# Patient Record
Sex: Female | Born: 1980 | Race: Black or African American | Hispanic: No | Smoking: Former smoker
Health system: Southern US, Community
[De-identification: ages and names within clinical notes are randomized; demographics above are authoritative.]

## PROBLEM LIST (undated history)

## (undated) DIAGNOSIS — D649 Anemia, unspecified: Secondary | ICD-10-CM

## (undated) DIAGNOSIS — N979 Female infertility, unspecified: Secondary | ICD-10-CM

## (undated) DIAGNOSIS — J302 Other seasonal allergic rhinitis: Secondary | ICD-10-CM

## (undated) DIAGNOSIS — J45909 Unspecified asthma, uncomplicated: Secondary | ICD-10-CM

## (undated) DIAGNOSIS — R011 Cardiac murmur, unspecified: Secondary | ICD-10-CM

## (undated) DIAGNOSIS — I10 Essential (primary) hypertension: Secondary | ICD-10-CM

## (undated) DIAGNOSIS — D259 Leiomyoma of uterus, unspecified: Secondary | ICD-10-CM

## (undated) DIAGNOSIS — N189 Chronic kidney disease, unspecified: Secondary | ICD-10-CM

## (undated) DIAGNOSIS — R519 Headache, unspecified: Secondary | ICD-10-CM

## (undated) DIAGNOSIS — N840 Polyp of corpus uteri: Secondary | ICD-10-CM

## (undated) HISTORY — DX: Chronic kidney disease, unspecified: N18.9

## (undated) HISTORY — DX: Anemia, unspecified: D64.9

## (undated) HISTORY — DX: Other seasonal allergic rhinitis: J30.2

## (undated) HISTORY — DX: Leiomyoma of uterus, unspecified: D25.9

## (undated) HISTORY — DX: Unspecified asthma, uncomplicated: J45.909

## (undated) HISTORY — DX: Headache, unspecified: R51.9

## (undated) HISTORY — DX: Female infertility, unspecified: N97.9

## (undated) HISTORY — DX: Polyp of corpus uteri: N84.0

## (undated) HISTORY — DX: Cardiac murmur, unspecified: R01.1

---
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
## 2010-10-02 ENCOUNTER — Emergency Department (HOSPITAL_COMMUNITY)
Admission: EM | Admit: 2010-10-02 | Discharge: 2010-10-02 | Disposition: A | Discharge status: Home / Self Care | Payer: Self-pay | Attending: Emergency Medicine | Admitting: Emergency Medicine

## 2010-10-02 ENCOUNTER — Emergency Department (HOSPITAL_COMMUNITY): Payer: Self-pay

## 2010-10-02 DIAGNOSIS — X58XXXA Exposure to other specified factors, initial encounter: Secondary | ICD-10-CM | POA: Insufficient documentation

## 2010-10-02 DIAGNOSIS — S93409A Sprain of unspecified ligament of unspecified ankle, initial encounter: Secondary | ICD-10-CM | POA: Insufficient documentation

## 2010-10-02 DIAGNOSIS — J45909 Unspecified asthma, uncomplicated: Secondary | ICD-10-CM | POA: Insufficient documentation

## 2010-10-02 DIAGNOSIS — I10 Essential (primary) hypertension: Secondary | ICD-10-CM | POA: Insufficient documentation

## 2011-01-31 ENCOUNTER — Emergency Department (HOSPITAL_COMMUNITY)
Admission: EM | Admit: 2011-01-31 | Discharge: 2011-01-31 | Disposition: A | Discharge status: Home / Self Care | Payer: Self-pay | Attending: Emergency Medicine | Admitting: Emergency Medicine

## 2011-01-31 DIAGNOSIS — R51 Headache: Secondary | ICD-10-CM | POA: Insufficient documentation

## 2011-01-31 DIAGNOSIS — A499 Bacterial infection, unspecified: Secondary | ICD-10-CM | POA: Insufficient documentation

## 2011-01-31 DIAGNOSIS — I10 Essential (primary) hypertension: Secondary | ICD-10-CM | POA: Insufficient documentation

## 2011-01-31 DIAGNOSIS — B9689 Other specified bacterial agents as the cause of diseases classified elsewhere: Secondary | ICD-10-CM | POA: Insufficient documentation

## 2011-01-31 DIAGNOSIS — N760 Acute vaginitis: Secondary | ICD-10-CM | POA: Insufficient documentation

## 2011-01-31 LAB — URINALYSIS, ROUTINE W REFLEX MICROSCOPIC
Bilirubin Urine: NEGATIVE
Ketones, ur: NEGATIVE mg/dL
Nitrite: NEGATIVE
Urobilinogen, UA: 0.2 mg/dL (ref 0.0–1.0)

## 2011-01-31 LAB — POCT I-STAT, CHEM 8
BUN: 9 mg/dL (ref 6–23)
Calcium, Ion: 1.16 mmol/L (ref 1.12–1.32)
Chloride: 105 mEq/L (ref 96–112)
Glucose, Bld: 80 mg/dL (ref 70–99)
HCT: 32 % — ABNORMAL LOW (ref 36.0–46.0)
Potassium: 3.6 mEq/L (ref 3.5–5.1)

## 2011-01-31 LAB — WET PREP, GENITAL: Yeast Wet Prep HPF POC: NONE SEEN

## 2011-01-31 LAB — POCT PREGNANCY, URINE: Preg Test, Ur: NEGATIVE

## 2011-01-31 LAB — URINE MICROSCOPIC-ADD ON

## 2011-02-01 LAB — GC/CHLAMYDIA PROBE AMP, GENITAL: GC Probe Amp, Genital: NEGATIVE

## 2011-03-11 IMAGING — CR DG TIBIA/FIBULA 2V*R*
4 series · 4 of 4 positions shown · non-contrast
Comparison: None

CLINICAL DATA: Right lower leg pain.  Ankle pain.

RIGHT TIBIA AND FIBULA - 2 VIEW

[t tib/fib ap right (1 of 2)]
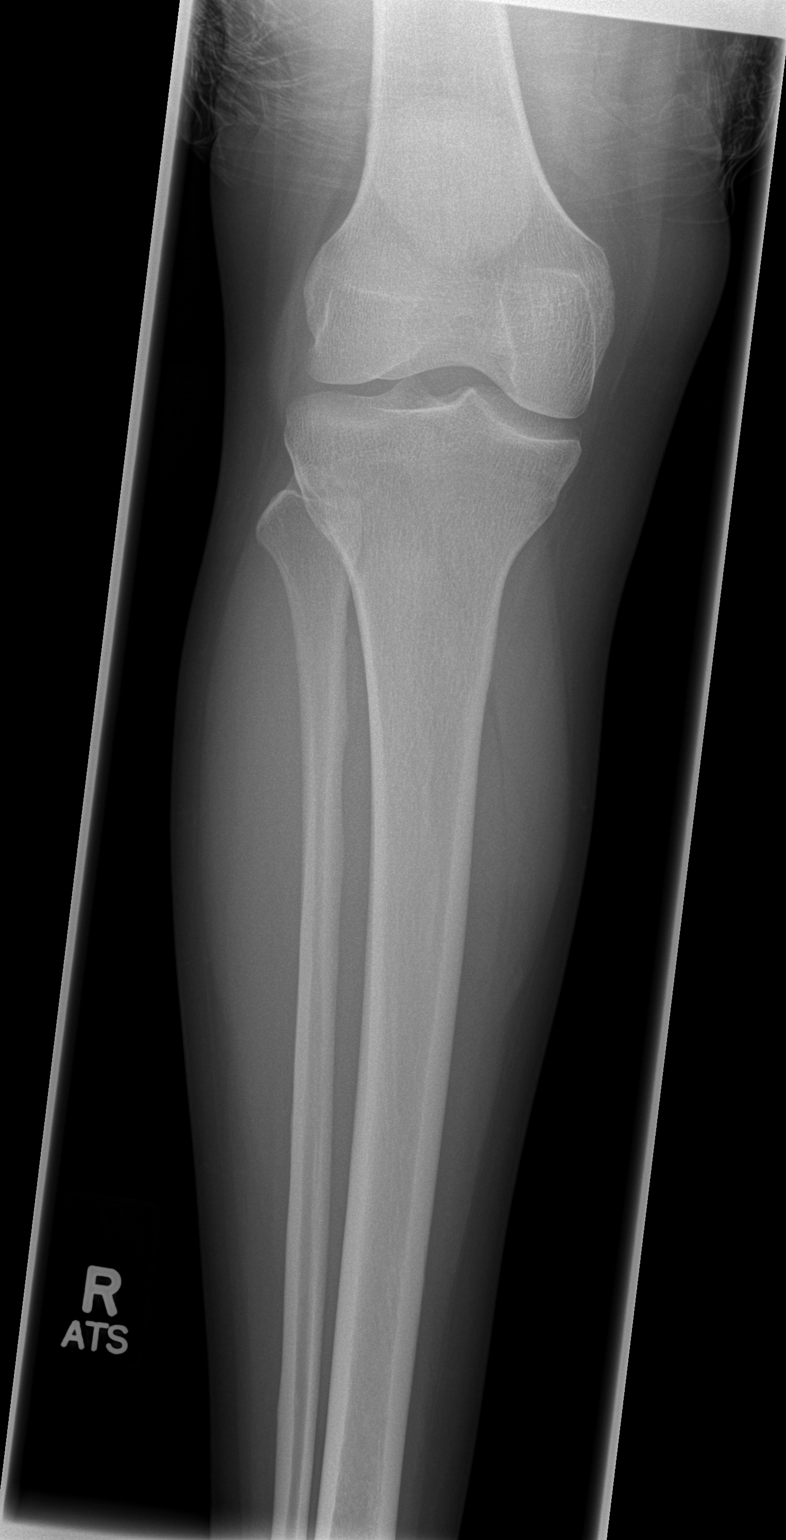

[t tib/fib ap right (2 of 2)]
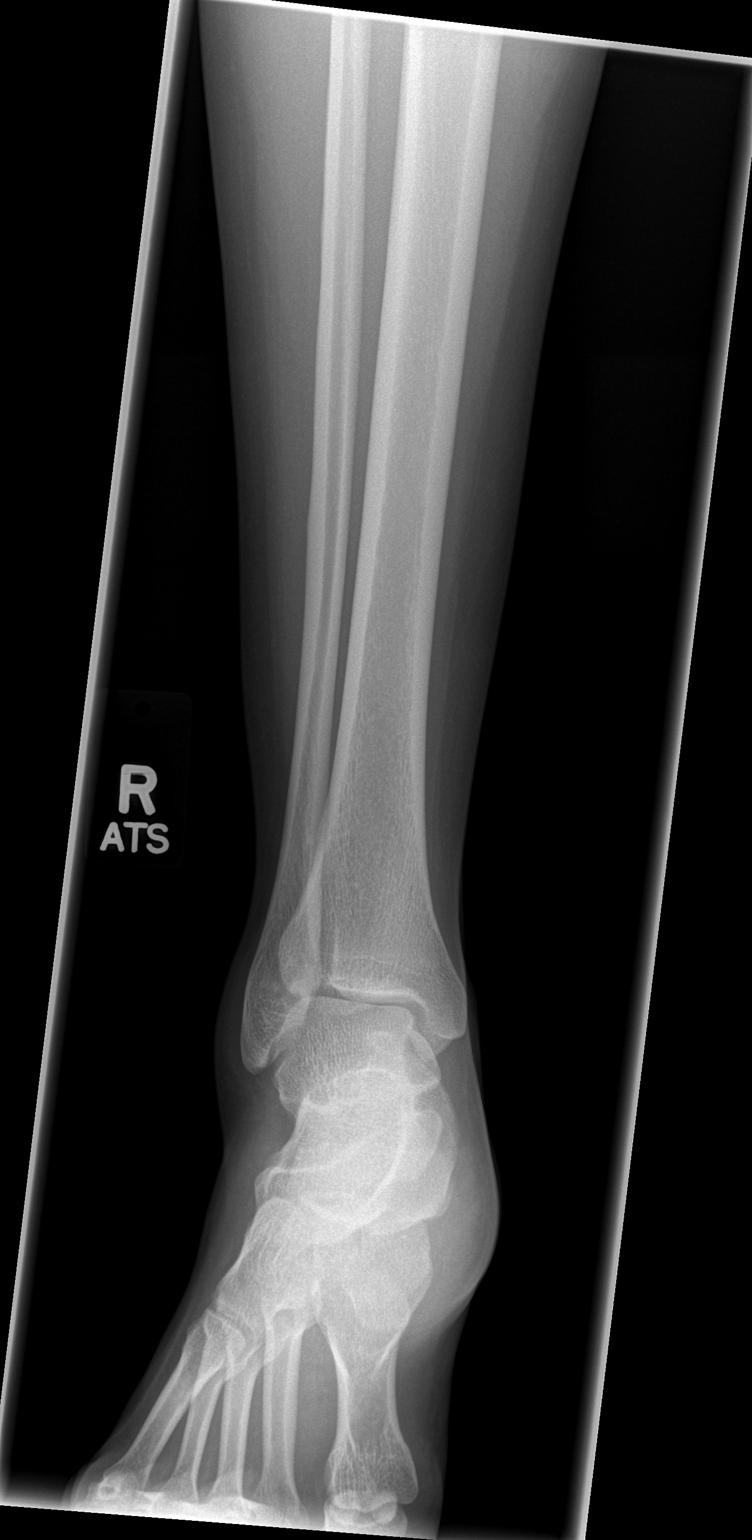

[t tib/fib lat right (1 of 2)]
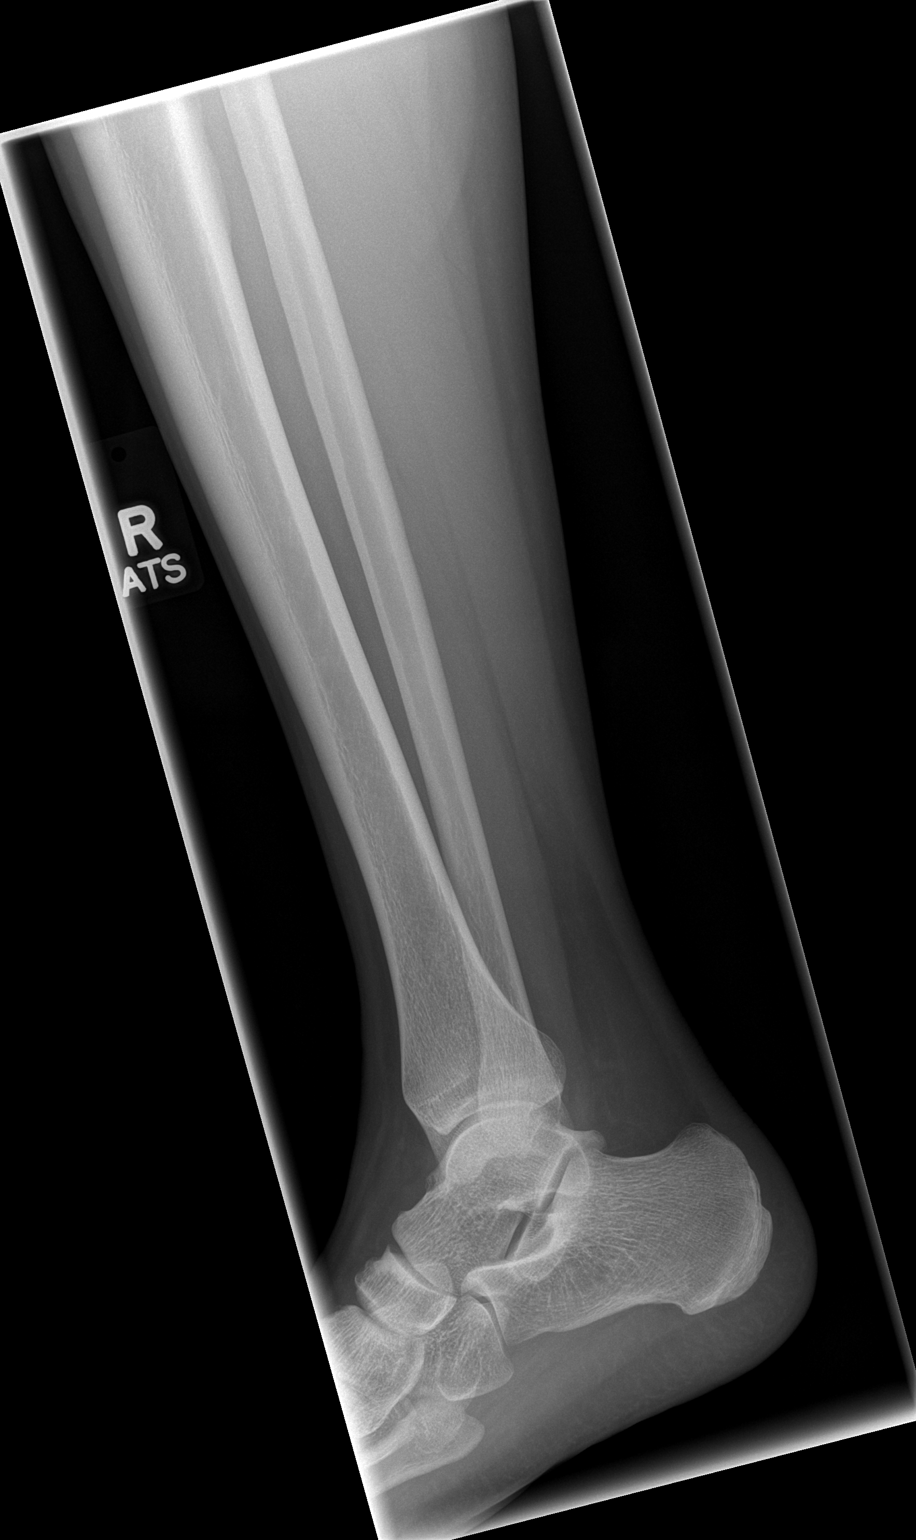

[t tib/fib lat right (2 of 2)]
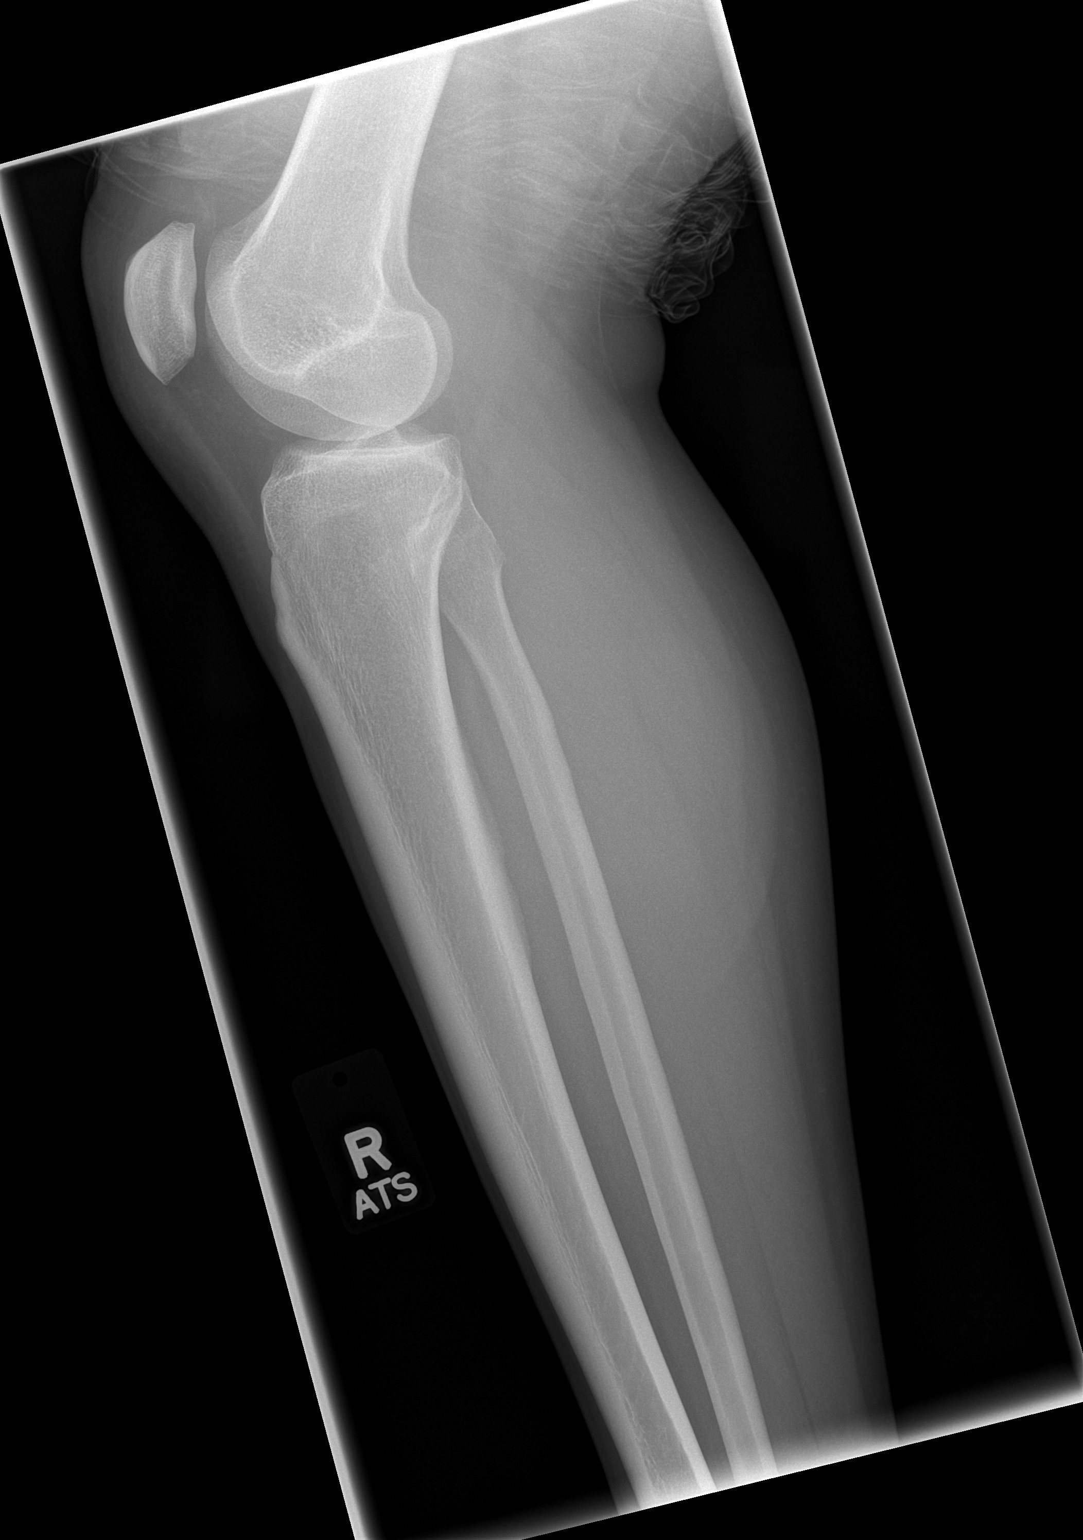

[4 of 4 positions shown; findings below may reference images not displayed]

FINDINGS: No fracture, foreign body, or acute bony findings are
identified.
IMPRESSION: No significant abnormality identified.

## 2011-06-26 ENCOUNTER — Encounter: Payer: Self-pay | Admitting: *Deleted

## 2011-06-26 ENCOUNTER — Emergency Department (HOSPITAL_COMMUNITY)
Admission: EM | Admit: 2011-06-26 | Discharge: 2011-06-26 | Disposition: A | Discharge status: Home / Self Care | Payer: Self-pay | Attending: Emergency Medicine | Admitting: Emergency Medicine

## 2011-06-26 DIAGNOSIS — M545 Low back pain, unspecified: Secondary | ICD-10-CM | POA: Insufficient documentation

## 2011-06-26 DIAGNOSIS — N946 Dysmenorrhea, unspecified: Secondary | ICD-10-CM | POA: Insufficient documentation

## 2011-06-26 DIAGNOSIS — D509 Iron deficiency anemia, unspecified: Secondary | ICD-10-CM | POA: Insufficient documentation

## 2011-06-26 DIAGNOSIS — I10 Essential (primary) hypertension: Secondary | ICD-10-CM | POA: Insufficient documentation

## 2011-06-26 DIAGNOSIS — R109 Unspecified abdominal pain: Secondary | ICD-10-CM | POA: Insufficient documentation

## 2011-06-26 DIAGNOSIS — N949 Unspecified condition associated with female genital organs and menstrual cycle: Secondary | ICD-10-CM | POA: Insufficient documentation

## 2011-06-26 HISTORY — DX: Essential (primary) hypertension: I10

## 2011-06-26 LAB — COMPREHENSIVE METABOLIC PANEL
ALT: 5 U/L (ref 0–35)
Alkaline Phosphatase: 44 U/L (ref 39–117)
BUN: 11 mg/dL (ref 6–23)
CO2: 21 mEq/L (ref 19–32)
Chloride: 106 mEq/L (ref 96–112)
GFR calc Af Amer: 78 mL/min — ABNORMAL LOW (ref >90)
GFR calc non Af Amer: 67 mL/min — ABNORMAL LOW (ref >90)
Glucose, Bld: 79 mg/dL (ref 70–99)
Potassium: 3.9 mEq/L (ref 3.5–5.1)
Sodium: 135 mEq/L (ref 135–145)
Total Bilirubin: 0.2 mg/dL — ABNORMAL LOW (ref 0.3–1.2)
Total Protein: 7.2 g/dL (ref 6.0–8.3)

## 2011-06-26 LAB — CBC
Hemoglobin: 10.1 g/dL — ABNORMAL LOW (ref 12.0–15.0)
MCH: 27.7 pg (ref 26.0–34.0)
Platelets: 288 10*3/uL (ref 150–400)
RBC: 3.65 MIL/uL — ABNORMAL LOW (ref 3.87–5.11)
WBC: 7.7 10*3/uL (ref 4.0–10.5)

## 2011-06-26 LAB — WET PREP, GENITAL: Yeast Wet Prep HPF POC: NONE SEEN

## 2011-06-26 LAB — DIFFERENTIAL
Eosinophils Absolute: 0 10*3/uL (ref 0.0–0.7)
Lymphocytes Relative: 19 % (ref 12–46)
Lymphs Abs: 1.5 10*3/uL (ref 0.7–4.0)
Monocytes Relative: 6 % (ref 3–12)
Neutrophils Relative %: 74 % (ref 43–77)

## 2011-06-26 LAB — PREGNANCY, URINE: Preg Test, Ur: NEGATIVE

## 2011-06-26 LAB — URINALYSIS, MICROSCOPIC ONLY
Glucose, UA: NEGATIVE mg/dL
Ketones, ur: NEGATIVE mg/dL
Leukocytes, UA: NEGATIVE
Nitrite: NEGATIVE
pH: 7 (ref 5.0–8.0)

## 2011-06-26 MED ORDER — FERROUS SULFATE 325 (65 FE) MG PO TABS: 325.0000 mg | ORAL_TABLET | Freq: Every day | ORAL | Status: DC

## 2011-06-26 MED ORDER — IBUPROFEN 800 MG PO TABS: 800.0000 mg | ORAL_TABLET | Freq: Three times a day (TID) | ORAL | Status: AC

## 2011-06-26 MED ORDER — KETOROLAC TROMETHAMINE 30 MG/ML IJ SOLN
30.0000 mg | Freq: Once | INTRAMUSCULAR | Status: AC
  Administered 2011-06-26: 30 mg via INTRAVENOUS
  Filled 2011-06-26: qty 1

## 2011-06-26 MED ORDER — SODIUM CHLORIDE 0.9 % IV SOLN
INTRAVENOUS | Status: DC
  Administered 2011-06-26: 09:00:00 via INTRAVENOUS

## 2011-06-26 NOTE — ED Provider Notes (Signed)
History     CSN: [id number] Arrival date & time: 06/26/2011  6:39 AM   First MD Initiated Contact with Patient 06/26/11 [phone]      Chief Complaint  Patient presents with  . Abdominal Pain    (Consider location/radiation/quality/duration/timing/severity/associated sxs/prior treatment) Patient is a 30 y.o. female presenting with abdominal pain. The history is provided by the patient and medical records. No language interpreter was used.  Abdominal Pain The primary symptoms of the illness include abdominal pain and vaginal bleeding. The primary symptoms of the illness do not include nausea, vomiting, diarrhea or dysuria. The current episode started 13 to 24 hours ago. The onset of the illness was sudden. The problem has not changed since onset. Vaginal bleeding was first noticed 2 days ago. Number of occurrences: Pt had onset of menses 2 days ago, and yesterday had onset of pain in the suprapubic region, cramping worse than previously. The quantity of blood was heavier than menses.  The patient states that she believes she is currently not pregnant. The patient has not had a change in bowel habit.    Past Medical History  Diagnosis Date  . Hypertension     History reviewed. No pertinent past surgical history.  History reviewed. No pertinent family history.  History  Substance Use Topics  . Smoking status: Not on file  . Smokeless tobacco: Not on file  . Alcohol Use:     OB History    Grav Para Term Preterm Abortions TAB SAB Ect Mult Living                  Review of Systems  Constitutional: Negative.   HENT: Negative.   Eyes: Negative.   Respiratory: Negative.   Cardiovascular: Negative.   Gastrointestinal: Positive for abdominal pain. Negative for nausea, vomiting and diarrhea.  Genitourinary: Positive for vaginal bleeding, menstrual problem and pelvic pain. Negative for dysuria.  Musculoskeletal: Negative.   Skin: Negative.   Neurological: Negative.     Psychiatric/Behavioral: Negative.     Allergies  Review of patient's allergies indicates no known allergies.  Home Medications   Current Outpatient Rx  Name Route Sig Dispense Refill  . ACETAMINOPHEN 500 MG PO TABS Oral Take 1,000 mg by mouth every 6 (six) hours as needed. For pain      . FERROUS SULFATE 325 (65 FE) MG PO TABS Oral Take 1 tablet (325 mg total) by mouth daily. 30 tablet 0  . IBUPROFEN 800 MG PO TABS Oral Take 1 tablet (800 mg total) by mouth 3 (three) times daily. 21 tablet 0    BP 132/92  Pulse 55  Temp(Src) 98 F (36.7 C) (Oral)  Resp 15  SpO2 99%  Physical Exam  Nursing note and vitals reviewed. Constitutional: She is oriented to person, place, and time. She appears well-developed and well-nourished. Distressed: in moderate distress with suprapubic and more back pain.  HENT:  Head: Normocephalic and atraumatic.  Right Ear: External ear normal.  Mouth/Throat: Oropharyngeal exudate:  Her oral mucosa is dry.  Eyes: Conjunctivae and EOM are normal. Pupils are equal, round, and reactive to light.  Neck: Normal range of motion. Neck supple. No thyromegaly present.  Cardiovascular: Normal rate, regular rhythm and normal heart sounds.   Pulmonary/Chest: Effort normal and breath sounds normal.  Abdominal: Soft. There is Tenderness: he has suprapubic tenderness..  Genitourinary:       Pt has heavy vaginal bleeding.  Her vaginal introitus is very tight.  The speculum would not enter  her vagina.  Swabs were obtained for GC/Chlamydia and wet prep.  Bimanual exam could not be performed due to her tight introitus.  Musculoskeletal:       He has pain localized to the lumbar region comment there is no palpable bony deformity or point tenderness there.  Lymphadenopathy:    She has no cervical adenopathy.  Neurological: She is alert and oriented to person, place, and time.       Sensory or motor deficits.  Skin: Skin is warm and dry.  Psychiatric: She has a normal mood  and affect. Her behavior is normal.    ED Course  Procedures (including critical care time)  2:48 PM Issues and had physical exam performed. IV fluids were ordered. Laboratory testing was ordered. Pelvic exam will be performed.  2:48 PM Results for orders placed during the hospital encounter of 06/26/11  CBC      Component Value Range   WBC 7.7  4.0 - 10.5 (K/uL)   RBC 3.65 (*) 3.87 - 5.11 (MIL/uL)   Hemoglobin 10.1 (*) 12.0 - 15.0 (g/dL)   HCT 16.1 (*) 09.6 - 46.0 (%)   MCV 84.7  78.0 - 100.0 (fL)   MCH 27.7  26.0 - 34.0 (pg)   MCHC 32.7  30.0 - 36.0 (g/dL)   RDW 04.5  40.9 - 81.1 (%)   Platelets 288  150 - 400 (K/uL)  DIFFERENTIAL      Component Value Range   Neutrophils Relative 74  43 - 77 (%)   Neutro Abs 5.7  1.7 - 7.7 (K/uL)   Lymphocytes Relative 19  12 - 46 (%)   Lymphs Abs 1.5  0.7 - 4.0 (K/uL)   Monocytes Relative 6  3 - 12 (%)   Monocytes Absolute 0.5  0.1 - 1.0 (K/uL)   Eosinophils Relative 1  0 - 5 (%)   Eosinophils Absolute 0.0  0.0 - 0.7 (K/uL)   Basophils Relative 0  0 - 1 (%)   Basophils Absolute 0.0  0.0 - 0.1 (K/uL)  COMPREHENSIVE METABOLIC PANEL      Component Value Range   Sodium 135  135 - 145 (mEq/L)   Potassium 3.9  3.5 - 5.1 (mEq/L)   Chloride 106  96 - 112 (mEq/L)   CO2 21  19 - 32 (mEq/L)   Glucose, Bld 79  70 - 99 (mg/dL)   BUN 11  6 - 23 (mg/dL)   Creatinine, Ser 9.14  0.50 - 1.10 (mg/dL)   Calcium 8.2 (*) 8.4 - 10.5 (mg/dL)   Total Protein 7.2  6.0 - 8.3 (g/dL)   Albumin 3.3 (*) 3.5 - 5.2 (g/dL)   AST 14  0 - 37 (U/L)   ALT 5  0 - 35 (U/L)   Alkaline Phosphatase 44  39 - 117 (U/L)   Total Bilirubin 0.2 (*) 0.3 - 1.2 (mg/dL)   GFR calc non Af Amer 67 (*) >90 (mL/min)   GFR calc Af Amer 78 (*) >90 (mL/min)  WET PREP, GENITAL      Component Value Range   Yeast, Wet Prep NONE SEEN  NONE SEEN    Trich, Wet Prep NONE SEEN  NONE SEEN    Clue Cells, Wet Prep NONE SEEN  NONE SEEN    WBC, Wet Prep HPF POC FEW (*) NONE SEEN   PREGNANCY,  URINE      Component Value Range   Preg Test, Ur NEGATIVE    URINALYSIS, MICROSCOPIC ONLY  Component Value Range   Color, Urine YELLOW  YELLOW    APPearance CLOUDY (*) CLEAR    Specific Gravity, Urine 1.013  1.005 - 1.030    pH 7.0  5.0 - 8.0    Glucose, UA NEGATIVE  NEGATIVE (mg/dL)   Hgb urine dipstick SMALL (*) NEGATIVE    Bilirubin Urine NEGATIVE  NEGATIVE    Ketones, ur NEGATIVE  NEGATIVE (mg/dL)   Protein, ur 409 (*) NEGATIVE (mg/dL)   Urobilinogen, UA 0.2  0.0 - 1.0 (mg/dL)   Nitrite NEGATIVE  NEGATIVE    Leukocytes, UA NEGATIVE  NEGATIVE    RBC / HPF 0-2  <3 (RBC/hpf)   Squamous Epithelial / LPF FEW (*) RARE   URINE CULTURE      Component Value Range   Specimen Description URINE, CLEAN CATCH     Special Requests NONE     Setup Time [id number]     Colony Count 3,000 COLONIES/ML     Culture INSIGNIFICANT GROWTH     Report Status 06/27/2011 FINAL     Lab tests show a mild anemia. There was no vaginal infection and a UTI. Prescription for ibuprofen for dysmenorrhea, and iron for anemia.   1. Dysmenorrhea   2. Iron deficiency anemia          Carleene Cooper III, MD 06/27/11 [phone]

## 2011-06-26 NOTE — ED Notes (Signed)
Pt states that she woke up with lower abdominal pain. Pt states that she is not pregnant. Pt states the pain is a 10/10.

## 2011-06-26 NOTE — ED Notes (Signed)
Pt states that she is having intermitant lower abdominal pain. Pt states that the pain feels like severe cramping. Pt states that she started spotting two days ago and then yesterday was flowing then today she stopped suddenly no more period. Pt states that she has no pain with urination or frequency. Pt states that she is not having any bowel problems. Pt alert and oriented and able to follow commands and move extremities.

## 2011-06-27 LAB — URINE CULTURE

## 2015-08-14 ENCOUNTER — Emergency Department (HOSPITAL_COMMUNITY)
Admission: EM | Admit: 2015-08-14 | Discharge: 2015-08-14 | Disposition: A | Discharge status: Home / Self Care | Payer: Self-pay | Attending: Emergency Medicine | Admitting: Emergency Medicine

## 2015-08-14 ENCOUNTER — Encounter (HOSPITAL_COMMUNITY): Payer: Self-pay | Admitting: Neurology

## 2015-08-14 DIAGNOSIS — N390 Urinary tract infection, site not specified: Secondary | ICD-10-CM

## 2015-08-14 DIAGNOSIS — I10 Essential (primary) hypertension: Secondary | ICD-10-CM | POA: Insufficient documentation

## 2015-08-14 DIAGNOSIS — B3731 Acute candidiasis of vulva and vagina: Secondary | ICD-10-CM

## 2015-08-14 DIAGNOSIS — B373 Candidiasis of vulva and vagina: Secondary | ICD-10-CM | POA: Insufficient documentation

## 2015-08-14 DIAGNOSIS — B9689 Other specified bacterial agents as the cause of diseases classified elsewhere: Secondary | ICD-10-CM

## 2015-08-14 DIAGNOSIS — Z79899 Other long term (current) drug therapy: Secondary | ICD-10-CM | POA: Insufficient documentation

## 2015-08-14 DIAGNOSIS — N760 Acute vaginitis: Secondary | ICD-10-CM | POA: Insufficient documentation

## 2015-08-14 DIAGNOSIS — Z3202 Encounter for pregnancy test, result negative: Secondary | ICD-10-CM | POA: Insufficient documentation

## 2015-08-14 DIAGNOSIS — F17200 Nicotine dependence, unspecified, uncomplicated: Secondary | ICD-10-CM | POA: Insufficient documentation

## 2015-08-14 LAB — WET PREP, GENITAL
Sperm: NONE SEEN
Trich, Wet Prep: NONE SEEN

## 2015-08-14 LAB — URINE MICROSCOPIC-ADD ON

## 2015-08-14 LAB — URINALYSIS, ROUTINE W REFLEX MICROSCOPIC
Bilirubin Urine: NEGATIVE
Glucose, UA: NEGATIVE mg/dL
Ketones, ur: NEGATIVE mg/dL
Nitrite: NEGATIVE
Protein, ur: 100 mg/dL — AB
Specific Gravity, Urine: 1.014 (ref 1.005–1.030)
pH: 6 (ref 5.0–8.0)

## 2015-08-14 LAB — POC URINE PREG, ED: Preg Test, Ur: NEGATIVE

## 2015-08-14 MED ORDER — HYDROXYZINE HCL 25 MG PO TABS
25.0000 mg | ORAL_TABLET | Freq: Once | ORAL | Status: AC
  Administered 2015-08-14: 25 mg via ORAL
  Filled 2015-08-14: qty 1

## 2015-08-14 MED ORDER — OXYCODONE-ACETAMINOPHEN 5-325 MG PO TABS: 2.0000 | ORAL_TABLET | ORAL | Status: DC | PRN

## 2015-08-14 MED ORDER — SULFAMETHOXAZOLE-TRIMETHOPRIM 800-160 MG PO TABS: 1.0000 | ORAL_TABLET | Freq: Two times a day (BID) | ORAL | Status: AC

## 2015-08-14 MED ORDER — OXYCODONE-ACETAMINOPHEN 5-325 MG PO TABS
1.0000 | ORAL_TABLET | Freq: Once | ORAL | Status: AC
  Administered 2015-08-14: 1 via ORAL
  Filled 2015-08-14: qty 1

## 2015-08-14 MED ORDER — HYDROXYZINE HCL 25 MG PO TABS: 25.0000 mg | ORAL_TABLET | Freq: Four times a day (QID) | ORAL | Status: DC

## 2015-08-14 MED ORDER — FLUCONAZOLE 100 MG PO TABS
150.0000 mg | ORAL_TABLET | Freq: Once | ORAL | Status: AC
  Administered 2015-08-14: 150 mg via ORAL
  Filled 2015-08-14: qty 2

## 2015-08-14 MED ORDER — METRONIDAZOLE 1 % EX GEL: Freq: Every day | CUTANEOUS | Status: DC

## 2015-08-14 NOTE — ED Notes (Addendum)
PA-C to see and assess patient before RN assessment. See PA-C note. Pelvic cart set up and at bedside.

## 2015-08-14 NOTE — ED Notes (Signed)
Pt reports vaginal itching and irritation for several days after using a new soap. Today she has had vaginal bleeding. LMP 07/30/15.

## 2015-08-14 NOTE — ED Provider Notes (Signed)
CSN: [id number]     Arrival date & time 08/14/15  1739 History  By signing my name below, I, Rayna Sexton, attest that this documentation has been prepared under the direction and in the presence of [employer], PA-C. Electronically Signed: Rayna Sexton, ED Scribe. 08/14/2015. 8:23 PM.    Chief Complaint  Patient presents with  . Vaginal Itching   The history is provided by the patient. No language interpreter was used.    HPI Comments: Tonya Wilson is a 35 y.o. female with a hx of bacterial vaginosis who presents to the Emergency Department complaining of intermittent, moderate, vaginal irritation onset a few days ago. Pt notes that she began using white Dial soap prior to the onset of her symptoms. Pt began applying a gel from a prior case of bacterial vaginosis and taking benadryl which provided short term relief until this morning. She now notes experiencing a continuation of her irritation with associated, white discharge, nausea, and vaginal bleeding. Pt states that her labia feel swollen. Her LNMP was 1/5. She denies vomiting, fevers, chills, abd pain or any other associated symptoms at this time.      Past Medical History  Diagnosis Date  . Hypertension    History reviewed. No pertinent past surgical history. No family history on file. Social History  Substance Use Topics  . Smoking status: Current Every Day Smoker  . Smokeless tobacco: None  . Alcohol Use: No   OB History    No data available     Review of Systems  All other systems reviewed and are negative.   Allergies  Flagyl  Home Medications   Prior to Admission medications   Medication Sig Start Date End Date Taking? Authorizing Provider  acetaminophen (TYLENOL) 500 MG tablet Take 1,000 mg by mouth every 6 (six) hours as needed. For pain      Historical Provider, MD  ferrous sulfate 325 (65 FE) MG tablet Take 1 tablet (325 mg total) by mouth daily. 06/26/11 06/25/12  Mylinda Latina, MD    BP 152/116 mmHg  Pulse 87  Temp(Src) 98.5 F (36.9 C) (Oral)  Resp 16  SpO2 100%  LMP 07/30/2015    Physical Exam  Constitutional: She is oriented to person, place, and time. She appears well-developed and well-nourished. No distress.  HENT:  Head: Normocephalic and atraumatic.  Eyes: Conjunctivae are normal. Right eye exhibits no discharge. Left eye exhibits no discharge. No scleral icterus.  Cardiovascular: Normal rate.   Pulmonary/Chest: Effort normal.  Genitourinary:  Copious white vaginal discharge in vaginal vault. Bilateral labia are exquisitely tender and very swollen and irritated. There is swelling at the introitus. Unable to insert speculum completely due to swelling and pain. Fix not visualized. No external genital lesions.  Neurological: She is alert and oriented to person, place, and time. Coordination normal.  Skin: Skin is warm and dry. No rash noted. She is not diaphoretic. No erythema. No pallor.  Psychiatric: She has a normal mood and affect. Her behavior is normal.  Nursing note and vitals reviewed.   ED Course  Procedures  DIAGNOSTIC STUDIES: Oxygen Saturation is 100% on RA, normal by my interpretation.    COORDINATION OF CARE: 8:19 PM Pt presents today due to vaginal irriation. Discussed next steps with pt at bedside including a pelvic exam. Pt agreed to plan.  Labs Review Labs Reviewed  WET PREP, GENITAL - Abnormal; Notable for the following:    Yeast Wet Prep HPF POC PRESENT (*)  Clue Cells Wet Prep HPF POC PRESENT (*)    WBC, Wet Prep HPF POC FEW (*)    All other components within normal limits  URINALYSIS, ROUTINE W REFLEX MICROSCOPIC (NOT AT [facility]) - Abnormal; Notable for the following:    Hgb urine dipstick MODERATE (*)    Protein, ur 100 (*)    Leukocytes, UA TRACE (*)    All other components within normal limits  URINE MICROSCOPIC-ADD ON - Abnormal; Notable for the following:    Squamous Epithelial / LPF 0-5 (*)    Bacteria, UA FEW (*)     All other components within normal limits  POC URINE PREG, ED  GC/CHLAMYDIA PROBE AMP ([place]) NOT AT [facility]    Imaging Review No results found. I have personally reviewed and evaluated these lab results as part of my medical decision-making.   EKG Interpretation None      MDM   Final diagnoses:  Vaginal candidiasis  Bacterial vaginosis  UTI (lower urinary tract infection)   Otherwise healthy 35 year old female presents with vaginal irritation onset 4 days ago after using Dial soap. No vaginal bleeding present. No dysuria. Patient does report increased urgency and frequency. On exam labia are extremely tender and swollen. Copious white vaginal discharge in vaginal vault. Unable to insert speculum due to pain and swelling. Swabs collected at the introitus. Wet prep reveals yeast and clue cells. Pt given 150 mg fluconazole, pain medication and atarax for itching. UA reveals trace leukocytes, 6-30 WBCs and few bacteria. Given urgency and frequency symptoms we'll treat UTI as well with antibiotics. Low suspicion STI as patient is not sexually active. Encourage patient to avoid using the soap in the future. Patient may follow up with OB/GYN if symptoms do not improve. Referral given today. Return precautions outlined in patient discharge instructions. Patient is hemodynamically stable and ready for discharge.  I personally performed the services described in this documentation, which was scribed in my presence. The recorded information has been reviewed and is accurate.     Dondra Spry [place], PA-C 08/15/15 2312  Wandra Arthurs, MD 08/17/15 [phone]

## 2015-08-14 NOTE — Discharge Instructions (Signed)
Bacterial Vaginosis °Bacterial vaginosis is a vaginal infection that occurs when the normal balance of bacteria in the vagina is disrupted. It results from an overgrowth of certain bacteria. This is the most common vaginal infection in women of childbearing age. Treatment is important to prevent complications, especially in pregnant women, as it can cause a premature delivery. °CAUSES  °Bacterial vaginosis is caused by an increase in harmful bacteria that are normally present in smaller amounts in the vagina. Several different kinds of bacteria can cause bacterial vaginosis. However, the reason that the condition develops is not fully understood. °RISK FACTORS °Certain activities or behaviors can put you at an increased risk of developing bacterial vaginosis, including: °· Having a new sex partner or multiple sex partners. °· Douching. °· Using an intrauterine device (IUD) for contraception. °Women do not get bacterial vaginosis from toilet seats, bedding, swimming pools, or contact with objects around them. °SIGNS AND SYMPTOMS  °Some women with bacterial vaginosis have no signs or symptoms. Common symptoms include: °· Grey vaginal discharge. °· A fishlike odor with discharge, especially after sexual intercourse. °· Itching or burning of the vagina and vulva. °· Burning or pain with urination. °DIAGNOSIS  °Your health care provider will take a medical history and examine the vagina for signs of bacterial vaginosis. A sample of vaginal fluid may be taken. Your health care provider will look at this sample under a microscope to check for bacteria and abnormal cells. A vaginal pH test may also be done.  °TREATMENT  °Bacterial vaginosis may be treated with antibiotic medicines. These may be given in the form of a pill or a vaginal cream. A second round of antibiotics may be prescribed if the condition comes back after treatment. Because bacterial vaginosis increases your risk for sexually transmitted diseases, getting  treated can help reduce your risk for chlamydia, gonorrhea, HIV, and herpes. °HOME CARE INSTRUCTIONS  °· Only take over-the-counter or prescription medicines as directed by your health care provider. °· If antibiotic medicine was prescribed, take it as directed. Make sure you finish it even if you start to feel better. °· Tell all sexual partners that you have a vaginal infection. They should see their health care provider and be treated if they have problems, such as a mild rash or itching. °· During treatment, it is important that you follow these instructions: °· Avoid sexual activity or use condoms correctly. °· Do not douche. °· Avoid alcohol as directed by your health care provider. °· Avoid breastfeeding as directed by your health care provider. °SEEK MEDICAL CARE IF:  °· Your symptoms are not improving after 3 days of treatment. °· You have increased discharge or pain. °· You have a fever. °MAKE SURE YOU:  °· Understand these instructions. °· Will watch your condition. °· Will get help right away if you are not doing well or get worse. °FOR MORE INFORMATION  °Centers for Disease Control and Prevention, Division of STD Prevention: www.cdc.gov/std °American Sexual Health Association (ASHA): www.ashastd.org  °  °This information is not intended to replace advice given to you by your health care provider. Make sure you discuss any questions you have with your health care provider. °  °Document Released: 07/11/2005 Document Revised: 08/01/2014 Document Reviewed: 02/20/2013 °Elsevier Interactive Patient Education ©2016 Elsevier Inc. ° °Urinary Tract Infection °Urinary tract infections (UTIs) can develop anywhere along your urinary tract. Your urinary tract is your body's drainage system for removing wastes and extra water. Your urinary tract includes two kidneys, two ureters,   a bladder, and a urethra. Your kidneys are a pair of bean-shaped organs. Each kidney is about the size of your fist. They are located below  your ribs, one on each side of your spine. CAUSES Infections are caused by microbes, which are microscopic organisms, including fungi, viruses, and bacteria. These organisms are so small that they can only be seen through a microscope. Bacteria are the microbes that most commonly cause UTIs. SYMPTOMS  Symptoms of UTIs may vary by age and gender of the patient and by the location of the infection. Symptoms in young women typically include a frequent and intense urge to urinate and a painful, burning feeling in the bladder or urethra during urination. Older women and men are more likely to be tired, shaky, and weak and have muscle aches and abdominal pain. A fever may mean the infection is in your kidneys. Other symptoms of a kidney infection include pain in your back or sides below the ribs, nausea, and vomiting. DIAGNOSIS To diagnose a UTI, your caregiver will ask you about your symptoms. Your caregiver will also ask you to provide a urine sample. The urine sample will be tested for bacteria and white blood cells. White blood cells are made by your body to help fight infection. TREATMENT  Typically, UTIs can be treated with medication. Because most UTIs are caused by a bacterial infection, they usually can be treated with the use of antibiotics. The choice of antibiotic and length of treatment depend on your symptoms and the type of bacteria causing your infection. HOME CARE INSTRUCTIONS  If you were prescribed antibiotics, take them exactly as your caregiver instructs you. Finish the medication even if you feel better after you have only taken some of the medication.  Drink enough water and fluids to keep your urine clear or pale yellow.  Avoid caffeine, tea, and carbonated beverages. They tend to irritate your bladder.  Empty your bladder often. Avoid holding urine for long periods of time.  Empty your bladder before and after sexual intercourse.  After a bowel movement, women should cleanse  from front to back. Use each tissue only once. SEEK MEDICAL CARE IF:   You have back pain.  You develop a fever.  Your symptoms do not begin to resolve within 3 days. SEEK IMMEDIATE MEDICAL CARE IF:   You have severe back pain or lower abdominal pain.  You develop chills.  You have nausea or vomiting.  You have continued burning or discomfort with urination. MAKE SURE YOU:   Understand these instructions.  Will watch your condition.  Will get help right away if you are not doing well or get worse.   This information is not intended to replace advice given to you by your health care provider. Make sure you discuss any questions you have with your health care provider.   Document Released: 04/20/2005 Document Revised: 04/01/2015 Document Reviewed: 08/19/2011 Elsevier Interactive Patient Education 2016 Elsevier Inc.  Monilial Vaginitis Vaginitis in a soreness, swelling and redness (inflammation) of the vagina and vulva. Monilial vaginitis is not a sexually transmitted infection. CAUSES  Yeast vaginitis is caused by yeast (candida) that is normally found in your vagina. With a yeast infection, the candida has overgrown in number to a point that upsets the chemical balance. SYMPTOMS   White, thick vaginal discharge.  Swelling, itching, redness and irritation of the vagina and possibly the lips of the vagina (vulva).  Burning or painful urination.  Painful intercourse. DIAGNOSIS  Things that may contribute  to monilial vaginitis are:  Postmenopausal and virginal states.  Pregnancy.  Infections.  Being tired, sick or stressed, especially if you had monilial vaginitis in the past.  Diabetes. Good control will help lower the chance.  Birth control pills.  Tight fitting garments.  Using bubble bath, feminine sprays, douches or deodorant tampons.  Taking certain medications that kill germs (antibiotics).  Sporadic recurrence can occur if you become ill. TREATMENT   Your caregiver will give you medication.  There are several kinds of anti monilial vaginal creams and suppositories specific for monilial vaginitis. For recurrent yeast infections, use a suppository or cream in the vagina 2 times a week, or as directed.  Anti-monilial or steroid cream for the itching or irritation of the vulva may also be used. Get your caregiver's permission.  Painting the vagina with methylene blue solution may help if the monilial cream does not work.  Eating yogurt may help prevent monilial vaginitis. HOME CARE INSTRUCTIONS   Finish all medication as prescribed.  Do not have sex until treatment is completed or after your caregiver tells you it is okay.  Take warm sitz baths.  Do not douche.  Do not use tampons, especially scented ones.  Wear cotton underwear.  Avoid tight pants and panty hose.  Tell your sexual partner that you have a yeast infection. They should go to their caregiver if they have symptoms such as mild rash or itching.  Your sexual partner should be treated as well if your infection is difficult to eliminate.  Practice safer sex. Use condoms.  Some vaginal medications cause latex condoms to fail. Vaginal medications that harm condoms are:  Cleocin cream.  Butoconazole (Femstat).  Terconazole (Terazol) vaginal suppository.  Miconazole (Monistat) (may be purchased over the counter). SEEK MEDICAL CARE IF:   You have a temperature by mouth above 102 F (38.9 C).  The infection is getting worse after 2 days of treatment.  The infection is not getting better after 3 days of treatment.  You develop blisters in or around your vagina.  You develop vaginal bleeding, and it is not your menstrual period.  You have pain when you urinate.  You develop intestinal problems.  You have pain with sexual intercourse.   This information is not intended to replace advice given to you by your health care provider. Make sure you discuss  any questions you have with your health care provider.  Follow up with women's health if symptoms do not improve. Take antibiotics as prescribed. Take Atarax as needed for itching and swelling. Return to the emergency department if you have severe worsening of your symptoms, increased pain, fever, increased vaginal discharge, vaginal bleeding, burning with urination, abdominal pain, vomiting.

## 2015-08-14 NOTE — ED Notes (Signed)
Patient verbalized understanding of discharge instructions and denies any further needs or questions at this time. VS stable. Patient ambulatory with steady gait.  

## 2015-08-17 LAB — GC/CHLAMYDIA PROBE AMP (~~LOC~~) NOT AT ARMC
Chlamydia: NEGATIVE
Neisseria Gonorrhea: NEGATIVE

## 2016-07-25 DIAGNOSIS — N840 Polyp of corpus uteri: Secondary | ICD-10-CM

## 2016-07-25 DIAGNOSIS — D259 Leiomyoma of uterus, unspecified: Secondary | ICD-10-CM | POA: Insufficient documentation

## 2016-07-25 DIAGNOSIS — D352 Benign neoplasm of pituitary gland: Secondary | ICD-10-CM

## 2016-07-25 HISTORY — DX: Leiomyoma of uterus, unspecified: D25.9

## 2016-07-25 HISTORY — PX: HYSTEROSCOPY: SHX211

## 2016-07-25 HISTORY — DX: Polyp of corpus uteri: N84.0

## 2016-07-25 HISTORY — DX: Benign neoplasm of pituitary gland: D35.2

## 2016-11-22 HISTORY — PX: OTHER SURGICAL HISTORY: SHX169

## 2017-08-23 ENCOUNTER — Encounter: Payer: Self-pay | Admitting: Internal Medicine

## 2017-08-23 ENCOUNTER — Ambulatory Visit (INDEPENDENT_AMBULATORY_CARE_PROVIDER_SITE_OTHER): Payer: Managed Care, Other (non HMO) | Admitting: Internal Medicine

## 2017-08-23 VITALS — BP 148/102 | HR 68 | Resp 12 | Ht 62.0 in | Wt 158.0 lb

## 2017-08-23 DIAGNOSIS — N979 Female infertility, unspecified: Secondary | ICD-10-CM | POA: Diagnosis not present

## 2017-08-23 DIAGNOSIS — Z716 Tobacco abuse counseling: Secondary | ICD-10-CM

## 2017-08-23 DIAGNOSIS — Z720 Tobacco use: Secondary | ICD-10-CM

## 2017-08-23 DIAGNOSIS — J45909 Unspecified asthma, uncomplicated: Secondary | ICD-10-CM | POA: Diagnosis not present

## 2017-08-23 DIAGNOSIS — I10 Essential (primary) hypertension: Secondary | ICD-10-CM | POA: Diagnosis not present

## 2017-08-23 DIAGNOSIS — J302 Other seasonal allergic rhinitis: Secondary | ICD-10-CM | POA: Diagnosis not present

## 2017-08-23 DIAGNOSIS — Z79899 Other long term (current) drug therapy: Secondary | ICD-10-CM

## 2017-08-23 MED ORDER — NIFEDIPINE ER 30 MG PO TB24: 30.0000 mg | ORAL_TABLET | Freq: Every day | ORAL | 11 refills | Status: DC

## 2017-08-23 MED ORDER — LORATADINE 10 MG PO TABS: 10.0000 mg | ORAL_TABLET | Freq: Every day | ORAL | 11 refills | Status: DC

## 2017-08-23 NOTE — Progress Notes (Signed)
Subjective:    Patient ID: Tonya Wilson, female    DOB: 07-14-1981, 37 y.o.   MRN: [id number]  HPI   Here to establish  1.  Essential Hypertension:  Diagnosed at age 32 yo.  Mother, maternal grandmother and a sister all with diagnosis.  Was not obese at time of diagnosis.  Not secondary hypertension. Stopped taking Lisinopril after lost her grandmother in September.  Her last dose was beginning of October. She is trying to get pregnant, however, and discussed concerns for fetal abnormalities with use of ACE I.  2.  Pituitary microadenoma and hyperprolactinemia:  Diagnosed March 7. 2018 during work up of abnormal periods and eventual finding of elevated Prolactin.  On  Cabergoline 0.5 mg twice weekly.  Followed by Dr. Rolin Barry, Endocrinology at [facility].    3.  Infertility:  Dr. Rolin Barry is also now following her for infertility.  She did undergo Hysterosalpingogram 12/14/16 with findings of uterine polyp and submucosal leiomyoma. Has done 2 rounds of IUI without success.   4.  Asthma:  She is unaware of any diagnosis of her pulmonary system related to prematurity and requiring ventilation at birth.  Born at 7.5 months gestation.  She states she has been diagnosed with asthma since very young and gets worse with allergies in spring.   Has been told she coughs through the night every night.  Used to have Albuterol.  She has never been on inhaled corticosteroids.  Later states she was on Advair, but felt it was a hassle with rinsing out her mouth She feels she would use Albuterol if she had it.   She has never done PFTs.   She is a smoker  5.  BV:  Frequent problem with this.  Recently on Metronidazole from another provider.  Suggested probiotics by that provider.  Current Meds  Medication Sig  . acetaminophen (TYLENOL) 500 MG tablet Take 1,000 mg by mouth every 6 (six) hours as needed. For pain    . cabergoline (DOSTINEX) 0.5 MG tablet 0.25 mg 2 (two) times a week.     Allergies    Allergen Reactions  . Flagyl [Metronidazole] Rash   Past Medical History:  Diagnosis Date  . Asthma    Since birth.  . Hypertension age 11  . Infertility, female   . Prematurity 1983   delivered by C/S at 7.5 months gestation.  Ventilated for unknown period of time. Had left lung pneumothorax at 3 months with chest tube.  . Seasonal allergies child  . Uterine leiomyoma 2018   Removed 11/2016  . Uterine polyp 2018   Past Surgical History:  Procedure Laterality Date  . excision uterine leiomyoma  11/2016   Family History  Problem Relation Age of Onset  . Asthma Mother   . Hypertension Mother   . Thyroid disease Mother   . Hypertension Sister   . Anxiety disorder Sister     Social History   Socioeconomic History  . Marital status: Single    Spouse name: Not on file  . Number of children: 1  . Years of education: Not on file  . Highest education level: Associate degree: occupational, Hotel manager, or vocational program  Social Needs  . Financial resource strain: Not on file  . Food insecurity - worry: Often true  . Food insecurity - inability: Often true  . Transportation needs - medical: No  . Transportation needs - non-medical: No  Occupational History  . Not on file  Tobacco Use  . Smoking status:  Current Every Day Smoker    Packs/day: 0.15    Types: Cigarettes  . Smokeless tobacco: Never Used  . Tobacco comment: Would like to table for now.  Substance and Sexual Activity  . Alcohol use: Yes    Comment: occasional wine with dinner  . Drug use: No  . Sexual activity: No  Other Topics Concern  . Not on file  Social History Narrative  . Not on file           Review of Systems     Objective:   Physical Exam  NAD HEENT: PERRL, EOMI, TMs pearly gray, throat without injection, nasal mucosa with mild bogginess.  Cold sore lower left lip with 1 cm cluster of vesicles Neck:  Supple, no adenopathy, no thyromegaly Chest:  CTA CV:  RRR with normal S1 and  S2, No S3, S4, or murmur.  Radial and DP pulses normal and equal. Abd:  S, NT, No HSM or mass, + BS        Assessment & Plan:  1.  Essential Hypertension:  Avoiding Labetalol with low resting HR and likelihood of asthma.   Nifedipine ER 30 mg daily BP and pulse check in 1 week.  2.  History of asthma:  Will set up with spirometry.  Control allergies. Discussed using nicotine gum for smoking cessation  3.  Allergies:  Claritin 10 mg daily  4.  Infertility and Pituitary Microadenoma with elevated prolactin:  As per [facility] endocrine

## 2017-08-23 NOTE — Progress Notes (Signed)
SW Intern completed new patient screening tool to assess for any behavioral health needs and social determinants of health. Tonya Wilson reported feeling down some days, but attributed basic stress and fatigue to her daily job and lifestyle. No specific mental health concerns were noted. Tonya Wilson did disclose that in the past 12 months, she has had concerns over having enough food and the food she has lasting. No other resource needs were noted. SW Intern gave [employer] guide of local food supports as well as left contact information and opportunities for counseling services if interested in setting something up in the future.

## 2017-08-24 LAB — COMPREHENSIVE METABOLIC PANEL
ALT: 8 IU/L (ref 0–32)
AST: 17 IU/L (ref 0–40)
Albumin/Globulin Ratio: 1.5 (ref 1.2–2.2)
Albumin: 4.6 g/dL (ref 3.5–5.5)
Alkaline Phosphatase: 52 IU/L (ref 39–117)
BUN/Creatinine Ratio: 8 — ABNORMAL LOW (ref 9–23)
BUN: 11 mg/dL (ref 6–20)
Bilirubin Total: 0.3 mg/dL (ref 0.0–1.2)
CO2: 21 mmol/L (ref 20–29)
CREATININE: 1.35 mg/dL — AB (ref 0.57–1.00)
Calcium: 9 mg/dL (ref 8.7–10.2)
Chloride: 106 mmol/L (ref 96–106)
GFR calc non Af Amer: 51 mL/min/{1.73_m2} — ABNORMAL LOW (ref >59)
GFR, EST AFRICAN AMERICAN: 59 mL/min/{1.73_m2} — AB (ref >59)
GLOBULIN, TOTAL: 3 g/dL (ref 1.5–4.5)
Glucose: 75 mg/dL (ref 65–99)
Potassium: 4.3 mmol/L (ref 3.5–5.2)
Sodium: 142 mmol/L (ref 134–144)
TOTAL PROTEIN: 7.6 g/dL (ref 6.0–8.5)

## 2017-08-30 ENCOUNTER — Ambulatory Visit (INDEPENDENT_AMBULATORY_CARE_PROVIDER_SITE_OTHER): Payer: Managed Care, Other (non HMO)

## 2017-08-30 VITALS — BP 128/86 | HR 70

## 2017-08-30 DIAGNOSIS — I10 Essential (primary) hypertension: Secondary | ICD-10-CM

## 2017-08-30 NOTE — Progress Notes (Signed)
Patient BP now in normal range. Informed to continue current dose. Patient states medication gave her a headache the first 2 days of taking it, but since the headache has gone away. Informed to call if th headache comes back and continues with taking the medication. Patient verbalized understanding.

## 2017-09-27 ENCOUNTER — Other Ambulatory Visit (INDEPENDENT_AMBULATORY_CARE_PROVIDER_SITE_OTHER): Payer: Managed Care, Other (non HMO)

## 2017-09-27 VITALS — BP 150/100 | HR 76

## 2017-09-27 DIAGNOSIS — I10 Essential (primary) hypertension: Secondary | ICD-10-CM

## 2017-09-27 DIAGNOSIS — D509 Iron deficiency anemia, unspecified: Secondary | ICD-10-CM | POA: Diagnosis not present

## 2017-09-27 DIAGNOSIS — Z79899 Other long term (current) drug therapy: Secondary | ICD-10-CM

## 2017-09-27 MED ORDER — PRENATAL VITAMINS 28-0.8 MG PO TABS: ORAL_TABLET | ORAL | 3 refills | Status: DC

## 2017-09-27 MED ORDER — NIFEDIPINE ER 60 MG PO TB24: ORAL_TABLET | ORAL | 11 refills | Status: DC

## 2017-09-27 NOTE — Progress Notes (Signed)
Here for lab visit, but multiple questions:    1.  Mild anemia from labs at [facility] with low ferritin.  Encouraged to take ferrous gluconate 324 mg once daily in evening with her OJ.  2.  Encouraged to also take PNV in morning (she tolerates fine) for folic acid in particular  3.  Essential Hypertension:  Remains high on Procardia XL 30 mg.  Change to 60 mg.  Has followup end of month

## 2017-09-28 LAB — BASIC METABOLIC PANEL
BUN / CREAT RATIO: 13 (ref 9–23)
BUN: 19 mg/dL (ref 6–20)
CHLORIDE: 108 mmol/L — AB (ref 96–106)
CO2: 19 mmol/L — ABNORMAL LOW (ref 20–29)
Calcium: 8.1 mg/dL — ABNORMAL LOW (ref 8.7–10.2)
Creatinine, Ser: 1.45 mg/dL — ABNORMAL HIGH (ref 0.57–1.00)
GFR calc non Af Amer: 47 mL/min/{1.73_m2} — ABNORMAL LOW (ref >59)
GFR, EST AFRICAN AMERICAN: 54 mL/min/{1.73_m2} — AB (ref >59)
Glucose: 80 mg/dL (ref 65–99)
Potassium: 4.3 mmol/L (ref 3.5–5.2)
Sodium: 141 mmol/L (ref 134–144)

## 2017-09-30 ENCOUNTER — Encounter: Payer: Self-pay | Admitting: Internal Medicine

## 2017-10-10 ENCOUNTER — Telehealth: Payer: Self-pay

## 2017-10-10 MED ORDER — CLONIDINE HCL 0.1 MG PO TABS: ORAL_TABLET | ORAL | 4 refills | Status: DC

## 2017-10-10 NOTE — Telephone Encounter (Signed)
Not tolerating Procardia XL. Patient's ultimate goal is for pregnancy, so would like to have her on something for bp that is safe. Really would like to avoid Labetalol with her history of asthma after further reevaluation of chart. Will try clonidine, starting at a low dose 0.1 mg twice daily. New information that she was well Thydrated with last blood draw as well. Please let her know she can get drowsy with this initially, but often times that goes away with time.   She should take the second dose at bedtime.   Would like her to come in Friday for bp and pulse check if she starts tomorrow.  Sent to [employer]. To NOT restart procardia XL

## 2017-10-10 NOTE — Telephone Encounter (Signed)
Spoke with patient. Informed of medication change. Will come in after 3 pm on Friday.

## 2017-10-10 NOTE — Telephone Encounter (Signed)
Patient called stating since taking the Procardia 60 mg for her BP she has been having shortness of breath , papitations to the point she feels she can hear her heart beating in her ear. Patient states she stopped the medication to see if it could be her other medications and symptoms went away. Patient states when she started back on it all her symptoms came back. Patient has since stopped the medication again. Patient would like to know what she needs to do or if Dr. Amil Amen can change her medication.  Patient is working but stated we can leave a detailed message on her voicemail.To Dr. Amil Amen for further direction.

## 2017-10-10 NOTE — Telephone Encounter (Addendum)
Her heart rate was okay to switch to  Beta blockers at last visit, but after review of chart and history of asthma, would not utilize Labetalol.

## 2017-10-13 ENCOUNTER — Ambulatory Visit (INDEPENDENT_AMBULATORY_CARE_PROVIDER_SITE_OTHER): Payer: Managed Care, Other (non HMO)

## 2017-10-13 VITALS — BP 128/80 | HR 60 | Resp 12

## 2017-10-13 DIAGNOSIS — I10 Essential (primary) hypertension: Secondary | ICD-10-CM

## 2017-10-18 ENCOUNTER — Ambulatory Visit: Payer: Managed Care, Other (non HMO) | Admitting: Internal Medicine

## 2017-12-05 ENCOUNTER — Telehealth: Payer: Self-pay | Admitting: Internal Medicine

## 2017-12-05 DIAGNOSIS — J45909 Unspecified asthma, uncomplicated: Secondary | ICD-10-CM

## 2017-12-05 NOTE — Telephone Encounter (Signed)
Patient states medication is not working, pressure spikes during the middle of the day.Not sure as to how to continue Rx.  Not sure if she needs to increase Rx, what does doctor want her to do.  Medicine is cloNIDine (CATAPRES) 0.1 Mg tablet  Patient can be reached at [phone].

## 2017-12-06 ENCOUNTER — Other Ambulatory Visit (HOSPITAL_COMMUNITY): Payer: Self-pay | Admitting: Respiratory Therapy

## 2017-12-06 NOTE — Telephone Encounter (Signed)
Spoke with patient to get more information. Patient states she was changed to Clonidine 0.1 mg back in march. Patient states medication is working except in the middle of the day. Patient states she is on disability at work right now due to her BP being so high. Patient states she feels like she needs something in the middle of the day to help keep BP down. Patient is still having headaches from BP being so high. Patient not sure what else she needs to do.  To Dr. Amil Amen for further direction.

## 2017-12-06 NOTE — Telephone Encounter (Signed)
Let's have her come in in middle of day when this seems to be an issue and get her bp checked.

## 2017-12-06 NOTE — Progress Notes (Signed)
Appointment scheduled for PFT for 12/12/17 @ 3 pm. Unaware order was placed incorrect. Patient is awre and verbalized understanding

## 2017-12-07 NOTE — Telephone Encounter (Signed)
Spoke with patient and informed her to come in during the day when feels BP is spiking. Patient now states it happens random. States it is mainly during the week she is ovulating. States that is when she gets the headaches and her BP spikes. Patient also wants to know if she should be concerned about her kidney function.  To Dr. Amil Amen for further direction.

## 2017-12-08 NOTE — Telephone Encounter (Signed)
She needs to come in when it is spiking or when she think it is or will--to give [place] a quick call to let us know she is on her way.

## 2017-12-08 NOTE — Telephone Encounter (Signed)
Also, would like her to give a urine sample for microscopic UA (sendo out)when next here.  I am a bit worried about her kidney function, but as her bp stays down, would like to see if this improves.  If UA is normal and does improve, then will likely not perform furtherworkup.  If UA is not normal or kidney function does not improve, will need to further evaluate.

## 2017-12-12 ENCOUNTER — Encounter (HOSPITAL_COMMUNITY): Payer: Self-pay

## 2017-12-15 NOTE — Telephone Encounter (Signed)
Discussed with patient. Will contact us when she will be coming in

## 2018-03-07 ENCOUNTER — Other Ambulatory Visit: Payer: Self-pay | Admitting: Internal Medicine

## 2018-04-02 NOTE — Addendum Note (Signed)
Addended by: Marcelino Duster on: 04/02/2018 06:03 PM   Modules accepted: Level of Service

## 2018-04-06 ENCOUNTER — Other Ambulatory Visit: Payer: Self-pay | Admitting: Internal Medicine

## 2018-05-06 ENCOUNTER — Other Ambulatory Visit: Payer: Self-pay | Admitting: Internal Medicine

## 2018-06-08 ENCOUNTER — Telehealth: Payer: Self-pay | Admitting: Internal Medicine

## 2018-06-08 NOTE — Telephone Encounter (Signed)
Patient called stating she has been waiting since August for a call back regarding cloNIDine (CATAPRES) 0.1 MG tablet is not working for her the way is suppose to.  blood pressure is not controlled. Patient can be reach at [phone].  Please advise

## 2018-06-08 NOTE — Telephone Encounter (Signed)
Patient called after hours with concern her bp was higher in the midafternoon.  Her bp is running 158/100 then.  To increase clonidine in the morning to 0.2 mg and call for appt on Monday to see me.  Not clear what happened to her follow up with OV after January

## 2018-06-11 NOTE — Telephone Encounter (Signed)
noted 

## 2018-07-04 ENCOUNTER — Ambulatory Visit: Payer: Managed Care, Other (non HMO) | Admitting: Internal Medicine

## 2018-07-11 ENCOUNTER — Ambulatory Visit (INDEPENDENT_AMBULATORY_CARE_PROVIDER_SITE_OTHER): Payer: Managed Care, Other (non HMO) | Admitting: Internal Medicine

## 2018-07-11 ENCOUNTER — Encounter: Payer: Self-pay | Admitting: Internal Medicine

## 2018-07-11 VITALS — BP 138/90 | HR 74 | Resp 12 | Ht 62.0 in | Wt 153.0 lb

## 2018-07-11 DIAGNOSIS — F439 Reaction to severe stress, unspecified: Secondary | ICD-10-CM | POA: Diagnosis not present

## 2018-07-11 DIAGNOSIS — I10 Essential (primary) hypertension: Secondary | ICD-10-CM | POA: Diagnosis not present

## 2018-07-11 MED ORDER — CLONIDINE HCL 0.1 MG PO TABS: ORAL_TABLET | ORAL | 11 refills | Status: DC

## 2018-07-11 NOTE — Progress Notes (Signed)
   Subjective:    Patient ID: Tonya Wilson, female    DOB: 06-23-1981, 37 y.o.   MRN: [id number]  HPI   1.  Hypertension:  Has not followed up as recommended.  She has self increased her Clonidine to 0.1 mg three times daily.   Takes at 6:30 a.m. 2:30 p.m.  10:30 p.m.  States she feels her bp going up sometimes starting at 1:30 p.m. 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   Will check her bp and generally in the 150/100 range then.    She is looking for another job.  She put attempts to get pregnant on hold until she stopped smoking cigarettes, which she has done the past 6 months.    Current Meds  Medication Sig  . acetaminophen (TYLENOL) 500 MG tablet Take 1,000 mg by mouth every 6 (six) hours as needed. For pain    . cabergoline (DOSTINEX) 0.5 MG tablet 0.25 mg 2 (two) times a week.   . cloNIDine (CATAPRES) 0.1 MG tablet 1 tab by mouth in the morning, 1 midday, and 2 at bedtime  . JUNEL FE 1/20 1-20 MG-MCG tablet 1 tablet daily.   . Prenatal Vit-Fe Fumarate-FA (PRENATAL VITAMINS) 28-0.8 MG TABS 1 tab by mouth daily in the morning  . [DISCONTINUED] cloNIDine (CATAPRES) 0.1 MG tablet TAKE 1 TABLET BY MOUTH TWICE DAILY, NEED APPT   Allergies  Allergen Reactions  . Flagyl [Metronidazole] Rash     Review of Systems     Objective:   Physical Exam NAD Lungs:  CTA CV:  RRR without murmur or rub.  Radial and DP pulses normal and equal LE:  No edema.       Assessment & Plan:  1.  Hypertension:  Discussed taking a set dose of Clonidine.  She is really interested in taking a dose midday instead of twice daily.   Concerned taking 2 tabs in the morning will make her too sleepy and does not have that effect with 1 tab. Clonidine 1 tab in AM, 1 midday, 2 at bedtime.  2.  Stress:  Referral to SW for techniques to calm stress at work.

## 2018-08-15 ENCOUNTER — Encounter: Payer: Self-pay | Admitting: Internal Medicine

## 2018-08-15 ENCOUNTER — Ambulatory Visit (INDEPENDENT_AMBULATORY_CARE_PROVIDER_SITE_OTHER): Payer: BC Managed Care – PPO | Admitting: Internal Medicine

## 2018-08-15 VITALS — BP 122/80 | HR 66 | Resp 12 | Ht 62.0 in | Wt 156.0 lb

## 2018-08-15 DIAGNOSIS — J45909 Unspecified asthma, uncomplicated: Secondary | ICD-10-CM | POA: Diagnosis not present

## 2018-08-15 DIAGNOSIS — I10 Essential (primary) hypertension: Secondary | ICD-10-CM

## 2018-08-15 DIAGNOSIS — F439 Reaction to severe stress, unspecified: Secondary | ICD-10-CM

## 2018-08-15 MED ORDER — PNV PRENATAL PLUS MULTIVITAMIN 27-1 MG PO TABS: ORAL_TABLET | ORAL | 3 refills | Status: DC

## 2018-08-15 NOTE — Addendum Note (Signed)
Addended by: Marcelino Duster on: 08/15/2018 01:10 PM   Modules accepted: Orders

## 2018-08-15 NOTE — Progress Notes (Signed)
   Subjective:    Patient ID: Tonya Wilson, female    DOB: 1980/09/04, 38 y.o.   MRN: [id number]  HPI  1.  Hypertension:  Feels well on Clonidine.  She is now back working on infertility and planning to do IVF With regards to treatment of hypertension and plans for pregnancy, we did try long acting Nifedipine, but caused dyspnea. She has a low resting heart rate, generally around 60 and also asthma as my concerns to try that. We have not tried methyldopa, but concerned would not be enough to control her bp She has taken HCTZ in past, but ultimately was not enough to control Bp and she was always urinating, which is a problem with her job. Was on Lisinopril/HCTZ in recent past, but when learned she was planning a pregnancy, did not restart with ACE I. Her infertility specialist, Dr. Rolin Barry, is with [facility], so they will be able to see our notes. He is aware of her taking Clonidine for bp  Discussed though not a first choice, is listed as a relatively safe drug in pregnancy.   She is not interested in changing her medication at this time to try and see if tolerated and works well for her  2.  Asthma:  Improved off cigarettes now for 7 months.  Not requiring rescue inhaler use for some time--more than 7 months ago. Review of Systems   Current Meds  Medication Sig  . acetaminophen (TYLENOL) 500 MG tablet Take 1,000 mg by mouth every 6 (six) hours as needed. For pain    . cabergoline (DOSTINEX) 0.5 MG tablet 0.25 mg 2 (two) times a week.   . cloNIDine (CATAPRES) 0.1 MG tablet 1 tab by mouth in the morning, 1 midday, and 2 at bedtime  . JUNEL FE 1/20 1-20 MG-MCG tablet 1 tablet daily.   Marland Kitchen loratadine (CLARITIN) 10 MG tablet Take 1 tablet (10 mg total) by mouth daily.  . Prenatal Vit-Fe Fumarate-FA (PRENATAL VITAMINS) 28-0.8 MG TABS 1 tab by mouth daily in the morning   Allergies  Allergen Reactions  . Flagyl [Metronidazole] Rash       Objective:   Physical Exam NAD Lungs:   CTA CV:  RRR without murmur or rub.  Radial pulses normal and equal LE:  No edema       Assessment & Plan:  1.  Essential hypertension:  BP now well controlled at current dosage of Clonidine and tolerating the 3 times a day dosing better than twice daily. She is to confirm with Dr. Rolin Barry that Clonidine is okay in his experience with pregnancy. See subjective for reasoning for clonidine with possibility of pregnancy. Could consider Methyldopa and HCTZ together, though patient is not keen about the HCTZ with increased urination.  2.  Asthma:  Asymptomatic with discontinuation of smoking.  3.  Stress:  Tonya Wilson to work with her.  Warm hand off today.

## 2018-08-25 ENCOUNTER — Encounter: Payer: Self-pay | Admitting: Internal Medicine

## 2018-10-04 DIAGNOSIS — N189 Chronic kidney disease, unspecified: Secondary | ICD-10-CM | POA: Diagnosis not present

## 2018-10-04 DIAGNOSIS — Z3009 Encounter for other general counseling and advice on contraception: Secondary | ICD-10-CM | POA: Diagnosis not present

## 2018-10-04 DIAGNOSIS — I10 Essential (primary) hypertension: Secondary | ICD-10-CM | POA: Diagnosis not present

## 2018-10-04 DIAGNOSIS — D352 Benign neoplasm of pituitary gland: Secondary | ICD-10-CM | POA: Diagnosis not present

## 2018-10-04 DIAGNOSIS — I129 Hypertensive chronic kidney disease with stage 1 through stage 4 chronic kidney disease, or unspecified chronic kidney disease: Secondary | ICD-10-CM | POA: Diagnosis not present

## 2018-10-04 DIAGNOSIS — R7989 Other specified abnormal findings of blood chemistry: Secondary | ICD-10-CM | POA: Diagnosis not present

## 2018-10-04 DIAGNOSIS — Z3169 Encounter for other general counseling and advice on procreation: Secondary | ICD-10-CM | POA: Diagnosis not present

## 2018-11-20 DIAGNOSIS — I129 Hypertensive chronic kidney disease with stage 1 through stage 4 chronic kidney disease, or unspecified chronic kidney disease: Secondary | ICD-10-CM | POA: Diagnosis not present

## 2018-11-20 DIAGNOSIS — D352 Benign neoplasm of pituitary gland: Secondary | ICD-10-CM | POA: Diagnosis not present

## 2018-11-20 DIAGNOSIS — Z87891 Personal history of nicotine dependence: Secondary | ICD-10-CM | POA: Diagnosis not present

## 2018-11-20 DIAGNOSIS — N183 Chronic kidney disease, stage 3 (moderate): Secondary | ICD-10-CM | POA: Diagnosis not present

## 2018-12-03 ENCOUNTER — Ambulatory Visit (INDEPENDENT_AMBULATORY_CARE_PROVIDER_SITE_OTHER): Payer: BC Managed Care – PPO | Admitting: Internal Medicine

## 2018-12-03 ENCOUNTER — Encounter: Payer: Self-pay | Admitting: Internal Medicine

## 2018-12-03 ENCOUNTER — Other Ambulatory Visit: Payer: Self-pay

## 2018-12-03 VITALS — BP 158/100 | HR 74 | Temp 98.1°F | Resp 12 | Ht 62.0 in | Wt 153.0 lb

## 2018-12-03 DIAGNOSIS — N2889 Other specified disorders of kidney and ureter: Secondary | ICD-10-CM

## 2018-12-03 DIAGNOSIS — I151 Hypertension secondary to other renal disorders: Secondary | ICD-10-CM | POA: Diagnosis not present

## 2018-12-03 MED ORDER — CLONIDINE HCL 0.1 MG PO TABS: ORAL_TABLET | ORAL | 11 refills | Status: DC

## 2018-12-03 NOTE — Patient Instructions (Addendum)
Call in daily bps in 2 weeks.  Continue to work on stress relief Get outside and walk everyday--gradually increase

## 2018-12-03 NOTE — Progress Notes (Signed)
    Subjective:    Patient ID: Tonya Wilson, female   DOB: 29-Oct-1980, 38 y.o.   MRN: [id number]   HPI   1.  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   Lost to follow up with [place] about 1 year ago until 12.2019.  NEver came back in for Urine with microscopy. She is stressed with all the testing and visits. Discussed importance of following through with all her evaluation   When asked if she understands the risk factors to herself with bp issues and CKD, she does not seem clear on these. Not clear why she is being sent to Endocrine when already established with Dr. Rolin Barry, who has followed her for 2 years. She continues on 0.1 mg of Clonidine in a.m., 0.1 mg midday, and 0.2 in the evening.  Stress:  Have tried to set her up with counseling, but never followed through with that as well.   Had a family emergency and never rescheduled back in February 2020.  Current Meds  Medication Sig  . acetaminophen (TYLENOL) 500 MG tablet Take 1,000 mg by mouth every 6 (six) hours as needed. For pain    . cabergoline (DOSTINEX) 0.5 MG tablet 0.25 mg 2 (two) times a week.   . cloNIDine (CATAPRES) 0.1 MG tablet 1 tab by mouth in the morning, 1 midday, and 2 at bedtime  . ferrous gluconate (FERGON) 324 MG tablet Take 324 mg by mouth daily with breakfast.  . JUNEL FE 1/20 1-20 MG-MCG tablet 1 tablet daily.   . Prenatal Vit-Fe Fumarate-FA (PNV PRENATAL PLUS MULTIVITAMIN) 27-1 MG TABS 1 tab by mouth daily   Allergies  Allergen Reactions  . Flagyl [Metronidazole] Rash     Review of Systems    Objective:   BP (!) 158/100 (BP Location: Left Arm, Patient Position: Sitting, Cuff Size: Normal)   Pulse 74   Temp 98.1 F (36.7 C) (Other (Comment)) Comment (Src): forehead  Resp 12   Ht 5\' 2"  (1.575 m)   Wt 153 lb (69.4 kg)   LMP 11/28/2018   BMI 27.98 kg/m   Physical Exam  NAD Lungs:  CTA CV:  RRR with normal and equal radial pulses.    Assessment & Plan  1.  Hypertension with CKD.  Unable to see all the testing done.  Does not have nephrotic range proteinuria at 712 g/g creatinine.  Last Creatinine 1.75 mg/dL She is to have an ultrasound soon--not clear when She does not seem to have an understanding of her personal physical risk regarding pregnancy and her renal disease. Call nephrology about her risk factors for renal failure with pregnancy as from literature her risk could be significant. Discussed at length that her developing kidney failure requiring dialysis may lead to other health issues etc which could make it difficult as a single parent to a child.  Not clear her increase in BP is just from her current stressors or otherwise. 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 As her bp apparently was running borderline high 130/90, will increase Clonidine in morning to 0.2 mg and continue midday at 0.1 and at night 0.2 mg. She will call in bps in 2 weeks. Follow up in 2 months.  2.  Stress:  Discussed calming techniques.  Once we have an LCSW in place, will see about counseling again.

## 2018-12-18 ENCOUNTER — Telehealth: Payer: Self-pay | Admitting: Internal Medicine

## 2018-12-18 NOTE — Telephone Encounter (Signed)
Pt. Called in with her two week bp report  See below-  12/05/2018 11:30 pm 115/93 pulse:92 12/06/2018 6:22 am 114/91 p: 74 12/07/2018 6:13 am 121/91 p: 79 12/08/2018 10:13 pm 126/93 p: 64 12/09/2018 12:00 pm 139/83 p:62 12/10/2018 6:14pm 128/92 p:64 12/11/2018 11:10 pm 162/99 p: 51 12/12/2018 6:12 am 131/93 p:58 12/13/2018 6:56 pm 118/91 p:59 12/14/2018 9:25pm 127/86 p: 54 12/15/2018 6:15 am 120/91 p: 78 12/16/2018 11:00 pm 125/92 p:65 12/17/2018 11:00 pm 120/91 p:90  Also- patient states Dr. Amil Amen wanted to know the name of the probiotics she was taking . The bottle only says Probiotic- [facility]

## 2018-12-19 NOTE — Telephone Encounter (Signed)
Lets have her check just twice weekly for the next 2 weeks and call those in before considering an increase of her bp med again.

## 2018-12-19 NOTE — Telephone Encounter (Signed)
To Dr. Jodelle Green

## 2018-12-21 NOTE — Telephone Encounter (Signed)
Spoke with patient  Verbalized understanding

## 2018-12-27 DIAGNOSIS — N19 Unspecified kidney failure: Secondary | ICD-10-CM | POA: Diagnosis not present

## 2019-01-04 ENCOUNTER — Telehealth: Payer: Self-pay | Admitting: Internal Medicine

## 2019-01-04 DIAGNOSIS — I151 Hypertension secondary to other renal disorders: Secondary | ICD-10-CM

## 2019-01-04 NOTE — Telephone Encounter (Signed)
Patient called to provide BP readings as follow:  - 12/22/18   BP-138/101;  Pulse - 60 @10 :37 p.m.  - 12/25/18 BP 125/91; Pulse -56 @ 10:25 a.m.  - 12/31/18 BP 147/99; Pulse - 62 @ 8:26 p.m.  - 01/04/19 BP 120/86; Pulse- 70 @ 8:05 am.  Patient also wants Dr. Amil Amen to knows she got the kidney US done last Thursday at [facility] and was told needs to have urine and blood test labs done  at the end of the month. She also states thinks the [place] results are available to be seen on her Chart and if Dr. Amil Amen has any question to call her back.  Please Advise.

## 2019-01-09 DIAGNOSIS — Z3189 Encounter for other procreative management: Secondary | ICD-10-CM | POA: Diagnosis not present

## 2019-01-11 NOTE — Telephone Encounter (Signed)
Patient called today stating provided BP readings last week and  will like to hear back from Dr. Amil Amen to know  what needs to be done to get BP under control.  To Dr. Amil Amen to advise.

## 2019-01-14 DIAGNOSIS — N183 Chronic kidney disease, stage 3 (moderate): Secondary | ICD-10-CM | POA: Diagnosis not present

## 2019-01-16 MED ORDER — CLONIDINE HCL 0.1 MG PO TABS: ORAL_TABLET | ORAL | 11 refills | Status: DC

## 2019-01-16 NOTE — Telephone Encounter (Signed)
Able to contact her nephrologist, Tonya Wilson.   Discussed concerns Tonya Wilson is possibly in denial about her kidney disease--that she does not understand her risk. Tonya Wilson shared both she and High Risk OB have been very frank with her regarding decreased kidney function with a pregnancy and increased risk for IUGR as well as preeclampsia.  She does not feel a biopsy and definitive knowledge of her CKD etiology is necessary for her to do before the in vitro and pregnancy. I subsequently called the patient and we had a more enlightening conversation--she was much clearer she understood the risks and chooses to proceed. She would like for me to call in the increased dose of Clonidine.

## 2019-01-16 NOTE — Telephone Encounter (Signed)
Long discussion with patient again today regarding her kidney issues. Discussed she has a primary problem with her kidneys.   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 Her enlarged left kidney is therefore taking on most of the renal work and appears to likely have ongoing injury.   She feels she was told by nephrology that just controlling her bp was all that was needed until follow up, but clarified with her that she told the nephrologist she did not want to proceed with biopsy and she was planning to proceed with fertility treatment and pregnancy.  Discussed pregnancy could worsen her kidney condition/function and between not knowing what she specifically has or what treatment might slow the progress of kidney failure and getting pregnant, she may proceed to kidney failure more rapidly and then have more difficulty raising a child. The best plan is to preserve her kidney function. Asked her to rethink her thoughts on biopsy and getting the kidney issue completely evaluated. I will try to get hold of her nephrologist as well For now, her bp, particularly the diastolic is too high.  Will increase her Clonidine to 2 tabs 3 times daily and she is to call in bps and pulses in 2 weeks.

## 2019-01-16 NOTE — Telephone Encounter (Signed)
See continued telephone note and conversation with Nephrology.

## 2019-01-18 DIAGNOSIS — N183 Chronic kidney disease, stage 3 (moderate): Secondary | ICD-10-CM | POA: Diagnosis not present

## 2019-01-28 DIAGNOSIS — R809 Proteinuria, unspecified: Secondary | ICD-10-CM | POA: Diagnosis not present

## 2019-01-28 DIAGNOSIS — R768 Other specified abnormal immunological findings in serum: Secondary | ICD-10-CM | POA: Diagnosis not present

## 2019-01-28 DIAGNOSIS — Z87891 Personal history of nicotine dependence: Secondary | ICD-10-CM | POA: Diagnosis not present

## 2019-01-28 DIAGNOSIS — N183 Chronic kidney disease, stage 3 (moderate): Secondary | ICD-10-CM | POA: Diagnosis not present

## 2019-01-28 DIAGNOSIS — Z9889 Other specified postprocedural states: Secondary | ICD-10-CM | POA: Diagnosis not present

## 2019-01-28 DIAGNOSIS — D352 Benign neoplasm of pituitary gland: Secondary | ICD-10-CM | POA: Diagnosis not present

## 2019-01-28 DIAGNOSIS — I129 Hypertensive chronic kidney disease with stage 1 through stage 4 chronic kidney disease, or unspecified chronic kidney disease: Secondary | ICD-10-CM | POA: Diagnosis not present

## 2019-01-28 DIAGNOSIS — Z79899 Other long term (current) drug therapy: Secondary | ICD-10-CM | POA: Diagnosis not present

## 2019-02-04 ENCOUNTER — Telehealth: Payer: Self-pay | Admitting: Internal Medicine

## 2019-02-04 NOTE — Telephone Encounter (Signed)
Patient called to provide BP reading as follow:                DATE           TIME               BP                  PULSE   1.         01/18/19          11:00 P          121/86              57   2.         01/20/19           06:30 A          114/83             61  3.         01/22/19           03:15 P          120/88             62   4.          01/24/19            10:34 P          123/86             65  5.          01/26/19             1:00 P           120/86             62  6.          01/28/19             12:24 P         128/92             57  7.           01/30/19             11:00 P        108/76             72  8.          02/01/19             09:10 P       124/86             61   PLEASE ADVISE.Marland Kitchen

## 2019-02-04 NOTE — Telephone Encounter (Signed)
To Dr. Amil Amen for [facility]

## 2019-02-05 NOTE — Telephone Encounter (Signed)
Spoke with patient. BP readings discussed. Patient verbalized understanding

## 2019-02-05 NOTE — Telephone Encounter (Signed)
BPs look much better--most are at goal, though would like to see diastolics in the 82U more often.   No change to medication at this time. Please notify Ms. Nicole Kindred

## 2019-02-13 ENCOUNTER — Ambulatory Visit (INDEPENDENT_AMBULATORY_CARE_PROVIDER_SITE_OTHER): Payer: BC Managed Care – PPO | Admitting: Internal Medicine

## 2019-02-13 ENCOUNTER — Other Ambulatory Visit: Payer: Self-pay

## 2019-02-13 ENCOUNTER — Encounter: Payer: Self-pay | Admitting: Internal Medicine

## 2019-02-13 VITALS — BP 124/82 | HR 70 | Resp 12 | Ht 62.0 in | Wt 148.0 lb

## 2019-02-13 DIAGNOSIS — N2889 Other specified disorders of kidney and ureter: Secondary | ICD-10-CM | POA: Diagnosis not present

## 2019-02-13 DIAGNOSIS — K5900 Constipation, unspecified: Secondary | ICD-10-CM

## 2019-02-13 DIAGNOSIS — I151 Hypertension secondary to other renal disorders: Secondary | ICD-10-CM | POA: Diagnosis not present

## 2019-02-13 DIAGNOSIS — N979 Female infertility, unspecified: Secondary | ICD-10-CM

## 2019-02-13 NOTE — Patient Instructions (Signed)
Metamucil, Citrucel or benefiber with your smoothie--drink lots of water throughout the day as you are doing.

## 2019-02-13 NOTE — Progress Notes (Signed)
    Subjective:    Patient ID: Tonya Wilson, female   DOB: 05/04/1981, 38 y.o.   MRN: [id number]   HPI   1.  Hypertension/CKD:  BPs at home have generally been much improved and at goal since 01/18/2019.  Today bp is acceptable, though would like to see diastolic more in 69G. Has not had Crea checked since March.   Sees Nephrology in November Spoke with Nephrology after last visit as was concerned whether patient in denial of CKD. Afterward, had good conversation with patient and she was more vocal of her understanding of risks and seems more aware of the risks than I previously understood. Tolerating Clonidine fine now.  Did have daytime sleepiness--but now as long as busy, does not fall asleep.  2.  Rheumatology:  No definitive diagnosis, but being started on Hydroxychloroquine when pregnant to prevent fetal HB with + anti-Ro.  3.  Infertility:  Planning for IVF in September.  She is on PNV with folic acid and iron.  4.  Constipation with iron in PNV.  Has not been able to find anything to help with constipation she can take with CKD  Current Meds  Medication Sig  . acetaminophen (TYLENOL) 500 MG tablet Take 1,000 mg by mouth every 6 (six) hours as needed. For pain    . cabergoline (DOSTINEX) 0.5 MG tablet 0.25 mg 2 (two) times a week.   . Cholecalciferol (VITAMIN D3) 50 MCG (2000 UT) TABS Take by mouth. 1 daily  . cloNIDine (CATAPRES) 0.1 MG tablet Increase to 2 tabs by mouth 3 times daily.  Tonya Wilson FE 1/20 1-20 MG-MCG tablet 1 tablet daily.   . Prenatal Vit-Fe Fumarate-FA (PNV PRENATAL PLUS MULTIVITAMIN) 27-1 MG TABS 1 tab by mouth daily   Allergies  Allergen Reactions  . Flagyl [Metronidazole] Rash     Review of Systems    Objective:   BP 124/82 (BP Location: Left Arm, Patient Position: Sitting, Cuff Size: Normal)   Pulse 70   Resp 12   Ht 5\' 2"  (1.575 m)   Wt 148 lb (67.1 kg)   LMP 01/24/2019   BMI 27.07 kg/m   Physical Exam  Lungs:  CTA CV:  RRR without  murmur or rub.  Radial and DP pulses normal and equal LE:  No edema   Assessment & Plan   1.  Secondary hypertension with CKD:  BP more acceptable.  BMP.  Follow up in October  2.  Infertility:  As per OB/GYN/Fertility--IVF in Sept.  Cabergoline for pituitary microadenoma.  3.  Rheum:  Plan for hydroxychloroquine for SSA Ab +  4.  Constipation:  Increase fluid intake and add fiber laxative to smoothie.

## 2019-02-14 LAB — BASIC METABOLIC PANEL
BUN/Creatinine Ratio: 9 (ref 9–23)
BUN: 13 mg/dL (ref 6–20)
CO2: 22 mmol/L (ref 20–29)
Calcium: 9 mg/dL (ref 8.7–10.2)
Chloride: 104 mmol/L (ref 96–106)
Creatinine, Ser: 1.43 mg/dL — ABNORMAL HIGH (ref 0.57–1.00)
GFR calc Af Amer: 54 mL/min/{1.73_m2} — ABNORMAL LOW (ref >59)
GFR calc non Af Amer: 47 mL/min/{1.73_m2} — ABNORMAL LOW (ref >59)
Glucose: 77 mg/dL (ref 65–99)
Potassium: 5.1 mmol/L (ref 3.5–5.2)
Sodium: 139 mmol/L (ref 134–144)

## 2019-02-15 DIAGNOSIS — K011 Impacted teeth: Secondary | ICD-10-CM | POA: Diagnosis not present

## 2019-03-01 DIAGNOSIS — Z119 Encounter for screening for infectious and parasitic diseases, unspecified: Secondary | ICD-10-CM | POA: Diagnosis not present

## 2019-04-03 DIAGNOSIS — Z01812 Encounter for preprocedural laboratory examination: Secondary | ICD-10-CM | POA: Diagnosis not present

## 2019-04-03 DIAGNOSIS — Z20828 Contact with and (suspected) exposure to other viral communicable diseases: Secondary | ICD-10-CM | POA: Diagnosis not present

## 2019-04-03 DIAGNOSIS — Z3141 Encounter for fertility testing: Secondary | ICD-10-CM | POA: Diagnosis not present

## 2019-04-04 DIAGNOSIS — Z3141 Encounter for fertility testing: Secondary | ICD-10-CM | POA: Diagnosis not present

## 2019-04-08 DIAGNOSIS — Z3183 Encounter for assisted reproductive fertility procedure cycle: Secondary | ICD-10-CM | POA: Diagnosis not present

## 2019-04-08 DIAGNOSIS — Z3141 Encounter for fertility testing: Secondary | ICD-10-CM | POA: Diagnosis not present

## 2019-04-10 DIAGNOSIS — Z3183 Encounter for assisted reproductive fertility procedure cycle: Secondary | ICD-10-CM | POA: Diagnosis not present

## 2019-04-10 DIAGNOSIS — Z3141 Encounter for fertility testing: Secondary | ICD-10-CM | POA: Diagnosis not present

## 2019-04-12 DIAGNOSIS — Z3141 Encounter for fertility testing: Secondary | ICD-10-CM | POA: Diagnosis not present

## 2019-04-15 DIAGNOSIS — Z3141 Encounter for fertility testing: Secondary | ICD-10-CM | POA: Diagnosis not present

## 2019-04-18 DIAGNOSIS — N979 Female infertility, unspecified: Secondary | ICD-10-CM | POA: Diagnosis not present

## 2019-04-18 DIAGNOSIS — Z3183 Encounter for assisted reproductive fertility procedure cycle: Secondary | ICD-10-CM | POA: Diagnosis not present

## 2019-04-18 DIAGNOSIS — M5080 Other cervical disc disorders, unspecified cervical region: Secondary | ICD-10-CM | POA: Diagnosis not present

## 2019-04-18 DIAGNOSIS — Z3189 Encounter for other procreative management: Secondary | ICD-10-CM | POA: Diagnosis not present

## 2019-04-24 DIAGNOSIS — Z3183 Encounter for assisted reproductive fertility procedure cycle: Secondary | ICD-10-CM | POA: Diagnosis not present

## 2019-04-30 DIAGNOSIS — N946 Dysmenorrhea, unspecified: Secondary | ICD-10-CM | POA: Diagnosis not present

## 2019-05-15 ENCOUNTER — Ambulatory Visit: Payer: BC Managed Care – PPO | Admitting: Internal Medicine

## 2019-06-13 DIAGNOSIS — R809 Proteinuria, unspecified: Secondary | ICD-10-CM | POA: Diagnosis not present

## 2019-06-13 DIAGNOSIS — I129 Hypertensive chronic kidney disease with stage 1 through stage 4 chronic kidney disease, or unspecified chronic kidney disease: Secondary | ICD-10-CM | POA: Diagnosis not present

## 2019-06-13 DIAGNOSIS — R768 Other specified abnormal immunological findings in serum: Secondary | ICD-10-CM | POA: Diagnosis not present

## 2019-06-13 DIAGNOSIS — N1831 Chronic kidney disease, stage 3a: Secondary | ICD-10-CM | POA: Diagnosis not present

## 2019-06-24 DIAGNOSIS — Z20828 Contact with and (suspected) exposure to other viral communicable diseases: Secondary | ICD-10-CM | POA: Diagnosis not present

## 2019-06-25 ENCOUNTER — Encounter: Payer: Self-pay | Admitting: Internal Medicine

## 2019-06-25 ENCOUNTER — Telehealth (INDEPENDENT_AMBULATORY_CARE_PROVIDER_SITE_OTHER): Payer: BC Managed Care – PPO | Admitting: Internal Medicine

## 2019-06-25 VITALS — BP 128/82 | HR 60

## 2019-06-25 DIAGNOSIS — I151 Hypertension secondary to other renal disorders: Secondary | ICD-10-CM | POA: Insufficient documentation

## 2019-06-25 DIAGNOSIS — K5900 Constipation, unspecified: Secondary | ICD-10-CM

## 2019-06-25 DIAGNOSIS — D259 Leiomyoma of uterus, unspecified: Secondary | ICD-10-CM

## 2019-06-25 DIAGNOSIS — G43829 Menstrual migraine, not intractable, without status migrainosus: Secondary | ICD-10-CM | POA: Diagnosis not present

## 2019-06-25 DIAGNOSIS — N2889 Other specified disorders of kidney and ureter: Secondary | ICD-10-CM

## 2019-06-25 MED ORDER — POLYETHYLENE GLYCOL 3350 17 GM/SCOOP PO POWD: ORAL | 11 refills | Status: DC

## 2019-06-25 NOTE — Progress Notes (Signed)
    Subjective:    Patient ID: Tonya Wilson, female   DOB: June 23, 1981, 38 y.o.   MRN: [id number]   HPI   Unable to connect via Up[employer] video today. Moved to Telephone visit after multiple attempts  1.  Secondary Hypertension:  BP s generally under 120/80 range.  Continues on Clonidine. States was higher with IVF process.    2.  Constipation:  Stopped her PNV and iron as she continued with constipation. Was using Metamucil and stool softeners.   Colace 100 mg twice daily did not help as well. BMs off PNV for 3 weeks--Has one 4 times weekly.  Soft and easy to pass.  While taking PNV--one BM once per week with abdominal cramping throughout the week.  Stools are hard.    3.  Migraines:  On BCPs to control heavy periods, dysmenorrhea, migraines.  Still with migraines at least monthly--not clear they have a regular pattern. Tylenol does not help much and limited with NSAIDS with CKD  Current Meds  Medication Sig  . acetaminophen (TYLENOL) 500 MG tablet Take 1,000 mg by mouth every 6 (six) hours as needed. For pain    . cabergoline (DOSTINEX) 0.5 MG tablet 0.25 mg 2 (two) times a week.   . Cholecalciferol (VITAMIN D3) 50 MCG (2000 UT) TABS Take by mouth. 1 daily  . cloNIDine (CATAPRES) 0.1 MG tablet Increase to 2 tabs by mouth 3 times daily.  [name] 1/20 1-20 MG-MCG tablet 1 tablet daily.   . Prenatal Vit-Fe Fumarate-FA (PNV PRENATAL PLUS MULTIVITAMIN) 27-1 MG TABS 1 tab by mouth daily   Allergies  Allergen Reactions  . Flagyl [Metronidazole] Rash     Review of Systems    Objective:   Vitals:   06/25/19 1624  BP: 128/82  Pulse: 60    Physical Exam  Home bp monitor just before visit today   Assessment & Plan  1.  Secondary Hypertension with CKD:  Controlled.  2.  Mild anemia/Constipation:  To restart Metamucil, Colace, and add Miralax 17g/8 oz fluid once daily after restarts PNV as well.  If not adequate, increase to twice daily with Miralax. Call if continues  to have constipation after addition of Miralax.  3.  Migraines/dysmenorrhea:  Back on low dose OCPs with possible plans to undergo laparoscopy to evaluate for endometriosis/fibroids and possible intervention. Discussed I would prefer she see a Neurologist to recommend treatment for migraines--not an optimal candidate for a triptan--with her hypertension, pituitary adenoma, and looking trying another round of IVF. She will call after the holidays when she is ready to consider this.

## 2019-08-19 DIAGNOSIS — R102 Pelvic and perineal pain: Secondary | ICD-10-CM | POA: Diagnosis not present

## 2019-09-09 DIAGNOSIS — N938 Other specified abnormal uterine and vaginal bleeding: Secondary | ICD-10-CM | POA: Diagnosis not present

## 2019-09-09 DIAGNOSIS — Z1151 Encounter for screening for human papillomavirus (HPV): Secondary | ICD-10-CM | POA: Diagnosis not present

## 2019-09-09 DIAGNOSIS — D251 Intramural leiomyoma of uterus: Secondary | ICD-10-CM | POA: Diagnosis not present

## 2019-09-09 DIAGNOSIS — N946 Dysmenorrhea, unspecified: Secondary | ICD-10-CM | POA: Diagnosis not present

## 2019-09-09 DIAGNOSIS — Z124 Encounter for screening for malignant neoplasm of cervix: Secondary | ICD-10-CM | POA: Diagnosis not present

## 2019-09-09 DIAGNOSIS — N939 Abnormal uterine and vaginal bleeding, unspecified: Secondary | ICD-10-CM | POA: Diagnosis not present

## 2019-09-16 DIAGNOSIS — N183 Chronic kidney disease, stage 3 unspecified: Secondary | ICD-10-CM | POA: Diagnosis not present

## 2019-09-25 DIAGNOSIS — D251 Intramural leiomyoma of uterus: Secondary | ICD-10-CM | POA: Diagnosis not present

## 2019-09-25 DIAGNOSIS — D252 Subserosal leiomyoma of uterus: Secondary | ICD-10-CM | POA: Diagnosis not present

## 2019-10-03 DIAGNOSIS — Z87891 Personal history of nicotine dependence: Secondary | ICD-10-CM | POA: Diagnosis not present

## 2019-10-03 DIAGNOSIS — N939 Abnormal uterine and vaginal bleeding, unspecified: Secondary | ICD-10-CM | POA: Diagnosis not present

## 2019-10-03 DIAGNOSIS — N183 Chronic kidney disease, stage 3 unspecified: Secondary | ICD-10-CM | POA: Diagnosis not present

## 2019-10-03 DIAGNOSIS — D251 Intramural leiomyoma of uterus: Secondary | ICD-10-CM | POA: Diagnosis not present

## 2019-10-03 DIAGNOSIS — Z01812 Encounter for preprocedural laboratory examination: Secondary | ICD-10-CM | POA: Diagnosis not present

## 2019-10-03 DIAGNOSIS — Z01818 Encounter for other preprocedural examination: Secondary | ICD-10-CM | POA: Diagnosis not present

## 2019-10-03 DIAGNOSIS — I129 Hypertensive chronic kidney disease with stage 1 through stage 4 chronic kidney disease, or unspecified chronic kidney disease: Secondary | ICD-10-CM | POA: Diagnosis not present

## 2019-10-03 DIAGNOSIS — N946 Dysmenorrhea, unspecified: Secondary | ICD-10-CM | POA: Diagnosis not present

## 2019-10-07 DIAGNOSIS — N183 Chronic kidney disease, stage 3 unspecified: Secondary | ICD-10-CM | POA: Diagnosis not present

## 2019-10-07 DIAGNOSIS — Z01818 Encounter for other preprocedural examination: Secondary | ICD-10-CM | POA: Diagnosis not present

## 2019-10-29 DIAGNOSIS — Z20828 Contact with and (suspected) exposure to other viral communicable diseases: Secondary | ICD-10-CM | POA: Diagnosis not present

## 2019-10-29 DIAGNOSIS — Z20822 Contact with and (suspected) exposure to covid-19: Secondary | ICD-10-CM | POA: Diagnosis not present

## 2019-11-05 DIAGNOSIS — I129 Hypertensive chronic kidney disease with stage 1 through stage 4 chronic kidney disease, or unspecified chronic kidney disease: Secondary | ICD-10-CM | POA: Diagnosis not present

## 2019-11-05 DIAGNOSIS — Z87891 Personal history of nicotine dependence: Secondary | ICD-10-CM | POA: Diagnosis not present

## 2019-11-05 DIAGNOSIS — N946 Dysmenorrhea, unspecified: Secondary | ICD-10-CM | POA: Diagnosis not present

## 2019-11-05 DIAGNOSIS — N944 Primary dysmenorrhea: Secondary | ICD-10-CM | POA: Diagnosis not present

## 2019-11-05 DIAGNOSIS — Z79899 Other long term (current) drug therapy: Secondary | ICD-10-CM | POA: Diagnosis not present

## 2019-11-05 DIAGNOSIS — D252 Subserosal leiomyoma of uterus: Secondary | ICD-10-CM | POA: Diagnosis not present

## 2019-11-05 DIAGNOSIS — D649 Anemia, unspecified: Secondary | ICD-10-CM | POA: Diagnosis not present

## 2019-11-05 DIAGNOSIS — D259 Leiomyoma of uterus, unspecified: Secondary | ICD-10-CM | POA: Diagnosis not present

## 2019-11-05 DIAGNOSIS — N183 Chronic kidney disease, stage 3 unspecified: Secondary | ICD-10-CM | POA: Diagnosis not present

## 2019-11-05 DIAGNOSIS — D251 Intramural leiomyoma of uterus: Secondary | ICD-10-CM | POA: Diagnosis not present

## 2019-11-05 DIAGNOSIS — N939 Abnormal uterine and vaginal bleeding, unspecified: Secondary | ICD-10-CM | POA: Diagnosis not present

## 2019-11-05 DIAGNOSIS — D352 Benign neoplasm of pituitary gland: Secondary | ICD-10-CM | POA: Diagnosis not present

## 2019-12-10 ENCOUNTER — Telehealth: Payer: Self-pay

## 2019-12-10 DIAGNOSIS — Z79899 Other long term (current) drug therapy: Secondary | ICD-10-CM | POA: Diagnosis not present

## 2019-12-10 DIAGNOSIS — N1831 Chronic kidney disease, stage 3a: Secondary | ICD-10-CM | POA: Diagnosis not present

## 2019-12-10 DIAGNOSIS — Z87891 Personal history of nicotine dependence: Secondary | ICD-10-CM | POA: Diagnosis not present

## 2019-12-10 DIAGNOSIS — I10 Essential (primary) hypertension: Secondary | ICD-10-CM | POA: Diagnosis not present

## 2019-12-10 DIAGNOSIS — I129 Hypertensive chronic kidney disease with stage 1 through stage 4 chronic kidney disease, or unspecified chronic kidney disease: Secondary | ICD-10-CM | POA: Diagnosis not present

## 2019-12-10 DIAGNOSIS — R760 Raised antibody titer: Secondary | ICD-10-CM | POA: Diagnosis not present

## 2019-12-10 DIAGNOSIS — Z79890 Hormone replacement therapy (postmenopausal): Secondary | ICD-10-CM | POA: Diagnosis not present

## 2019-12-10 DIAGNOSIS — D352 Benign neoplasm of pituitary gland: Secondary | ICD-10-CM | POA: Diagnosis not present

## 2019-12-10 NOTE — Telephone Encounter (Signed)
Patient called asking if she can take Amlodipine 5 mg. States her kidney specialist would like to start her on this medication. Patient states she discussed with you medications she should not take due to her having a low heart rate. Would like to know if this is one of the medications.  To Dr. Amil Amen for further direction.

## 2019-12-12 NOTE — Telephone Encounter (Signed)
Spoke with patient. Message given. Patient verbalized understanding. States she will pick up amlodipine and try first since specialist had already prescribed this medication.

## 2019-12-12 NOTE — Telephone Encounter (Signed)
Spoke with Dr. Amil Amen regarding patient low heart rate. States she would like patient to ask specialist about long acting procardia states she feel like that would be good for patient if she is still trying to conceive.

## 2019-12-25 ENCOUNTER — Ambulatory Visit: Payer: BC Managed Care – PPO | Admitting: Internal Medicine

## 2019-12-25 ENCOUNTER — Encounter: Payer: Self-pay | Admitting: Internal Medicine

## 2019-12-25 ENCOUNTER — Other Ambulatory Visit: Payer: Self-pay

## 2019-12-25 ENCOUNTER — Ambulatory Visit (INDEPENDENT_AMBULATORY_CARE_PROVIDER_SITE_OTHER): Payer: BC Managed Care – PPO | Admitting: Internal Medicine

## 2019-12-25 VITALS — BP 122/78 | HR 76 | Temp 98.2°F | Resp 12 | Ht 62.0 in | Wt 146.0 lb

## 2019-12-25 DIAGNOSIS — I7300 Raynaud's syndrome without gangrene: Secondary | ICD-10-CM | POA: Diagnosis not present

## 2019-12-25 DIAGNOSIS — G43009 Migraine without aura, not intractable, without status migrainosus: Secondary | ICD-10-CM | POA: Insufficient documentation

## 2019-12-25 DIAGNOSIS — G43829 Menstrual migraine, not intractable, without status migrainosus: Secondary | ICD-10-CM

## 2019-12-25 NOTE — Progress Notes (Signed)
Subjective:    Patient ID: Tonya Wilson, female   DOB: Jul 23, 1981, 39 y.o.   MRN: [id number]   HPI   1.  Secondary Hypertension:  Controlled.  Amlodipine added 12/10/2019 by Nephrology.  States when was going through egg retrieval last fall, was much higher.  Describes her diastolic pressures staying up in high 80-90 range.    2.  Migraines:  Has had headaches since 39 yo.  Headache starts at temples with deep throbbing bilaterally, then tends to go to right side.  Right temple--piercing into eye.  No eye watering No aura. Sometimes gets nauseated.  No vomiting. + photophobia and phonophobia   Generally starts 2 days before periods and 2 days after period every month.  Has noted when she took BCPs and skipped the non hormone containing pills to forgo a period, she did not have the headaches associated with period.  Since off work, has not had as many. .  Can have 10 migraines within a month. This is more so with working.  When she was off work for 6 weeks, she only had 3 migraines.  Generally last anywhere from 1-3 days.  Sleep can help, but when she awakens, seems to come right back.    She can get pain in nuchal area or neck.  This pain does not seem to move into her other headache at bifrontal area.  Has never used a triptan.    She has multiple complicating factors to decide what medication to use:    * CKD, stage 3 with non-nephrotic range proteinuria and secondary hypertension.  + family history of hypertension and personal history of hypertension since age 68 yo with protein in urine.  In work up with nephrology, 10/2018, noted positive ANA 1:160 speckled pattern, Anti-R Ab was low positive at 3.5, Anti-La, Smith, DS DNA and complements were normal.  Hepatitis B and C serology and RPR negative.  Sent for Rheum evaluation subsequently and not fel to demonstrate any symptoms or findings on exam that supported SLE, but recommended monitoring of symptoms of autoimmune disease.   Did recommend Plaquenil during a pregnancy due to anti-Ro Ab as at increased risk for neonatal lupus and congenital heart block.   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   Never agreed to renal biopsy.     * Pituitary microadenoma with elevated prolactin, on cabergoline  * Infertility and still considering another round of infertility treatment to conceive with IVF, so medications need to be safe for possible conception and pregnancy   3.  Discusses finger tips and toes turning white and numb when cold.  Can turn blue before going back to pink, but not a deep red.  Has had this for years.  Was started on Amlodipine 2 weeks ago   Current Meds  Medication Sig  . acetaminophen (TYLENOL) 500 MG tablet Take 1,000 mg by mouth every 6 (six) hours as needed. For pain    . amLODipine (NORVASC) 5 MG tablet Take 5 mg by mouth daily.  . cabergoline (DOSTINEX) 0.5 MG tablet 0.25 mg 2 (two) times a week.   . Cholecalciferol (VITAMIN D3) 50 MCG (2000 UT) TABS Take by mouth. 1 daily  . cloNIDine (CATAPRES) 0.1 MG tablet Increase to 2 tabs by mouth 3 times daily.  [name] 1/20 1-20 MG-MCG tablet 1 tablet daily.   . polyethylene glycol  powder (MIRALAX) 17 GM/SCOOP powder 17 g in 8 ounces water or juice once to twice daily  . Prenatal Vit-Fe Fumarate-FA (PNV PRENATAL PLUS MULTIVITAMIN) 27-1 MG TABS 1 tab by mouth daily   Allergies  Allergen Reactions  . Other Other (See Comments)    NO NSAIDS 2/2 CKD PER PT  . Flagyl [Metronidazole] Rash     Review of Systems    Objective:   BP 122/78 (BP Location: Left Arm, Patient Position: Sitting, Cuff Size: Normal)   Pulse 76   Temp 98.2 F (36.8 C) (Temporal)   Resp 12   Ht 5\' 2"  (1.575 m)   Wt 146 lb (66.2 kg)   LMP 12/10/2019   BMI 26.70 kg/m   Physical Exam  NAD HEENT:  PERRL, EOMI, discs appear sharp Neck:  Supple, No  adenopathy Chest:  CTA CV: RRR without murmur or rub.  Radial and DP pulses normal and equal LE:  No edema Neuro:  A & O x 3, CN  II-XII grossly intact.  DTRs 2+/4, Motor 5/5, sensory grossly normal.  Gait normal.   Assessment & Plan   1.  CKD with secondary hypertension:  Now well controlled with addition of Amlodipine 5 mg daily to clonidine. Recent creatinine down to 1.4.  Still with proteinuria  2.  Migraines/perhaps additional Muscular tension headaches:  With her complicated case and wanting to perhaps conceive with IVF in the future, will ask Neurology, Dr. Brett Fairy to evaluate patient and recommend treatment.   3.  Raynaud's phenomenon:  Has never mentioned this before, though apparently has suffered for some time.  Discussed keeping warm, particularly during wet and cold days, holding cold beverages, etc.  Amlodipine should help as well. Discussed could be part of a developing autoimmune disorder or simply a singular health issue.

## 2020-01-13 ENCOUNTER — Ambulatory Visit: Payer: BC Managed Care – PPO | Admitting: Internal Medicine

## 2020-01-27 ENCOUNTER — Other Ambulatory Visit: Payer: Self-pay | Admitting: Internal Medicine

## 2020-01-27 DIAGNOSIS — N2889 Other specified disorders of kidney and ureter: Secondary | ICD-10-CM

## 2020-02-18 ENCOUNTER — Other Ambulatory Visit: Payer: Self-pay | Admitting: Internal Medicine

## 2020-02-18 ENCOUNTER — Telehealth: Payer: Self-pay

## 2020-02-18 NOTE — Telephone Encounter (Signed)
error 

## 2020-02-26 ENCOUNTER — Ambulatory Visit (INDEPENDENT_AMBULATORY_CARE_PROVIDER_SITE_OTHER): Payer: BC Managed Care – PPO | Admitting: Neurology

## 2020-02-26 ENCOUNTER — Encounter: Payer: Self-pay | Admitting: Neurology

## 2020-02-26 VITALS — BP 107/79 | HR 50 | Ht 62.5 in | Wt 145.0 lb

## 2020-02-26 DIAGNOSIS — R801 Persistent proteinuria, unspecified: Secondary | ICD-10-CM

## 2020-02-26 DIAGNOSIS — G43101 Migraine with aura, not intractable, with status migrainosus: Secondary | ICD-10-CM

## 2020-02-26 DIAGNOSIS — D352 Benign neoplasm of pituitary gland: Secondary | ICD-10-CM

## 2020-02-26 DIAGNOSIS — C751 Malignant neoplasm of pituitary gland: Secondary | ICD-10-CM | POA: Insufficient documentation

## 2020-02-26 DIAGNOSIS — R519 Headache, unspecified: Secondary | ICD-10-CM

## 2020-02-26 DIAGNOSIS — N979 Female infertility, unspecified: Secondary | ICD-10-CM

## 2020-02-26 MED ORDER — AJOVY 225 MG/1.5ML ~~LOC~~ SOSY: PREFILLED_SYRINGE | SUBCUTANEOUS | 0 refills | Status: DC

## 2020-02-26 MED ORDER — AJOVY 225 MG/1.5ML ~~LOC~~ SOAJ: 225.0000 mg | SUBCUTANEOUS | 5 refills | Status: DC

## 2020-02-26 MED ORDER — EMGALITY (300 MG DOSE) 100 MG/ML ~~LOC~~ SOSY: 300.0000 mg | PREFILLED_SYRINGE | SUBCUTANEOUS | 2 refills | Status: DC

## 2020-02-26 NOTE — Addendum Note (Signed)
Addended by: Larey Seat on: 02/26/2020 03:09 PM   Modules accepted: Orders

## 2020-02-26 NOTE — Addendum Note (Signed)
Addended by: Darleen Crocker on: 02/26/2020 03:36 PM   Modules accepted: Orders

## 2020-02-26 NOTE — Progress Notes (Addendum)
Provider:  Larey Seat, MD  Primary Care Physician:  [address], [place] [address]     Referring Provider: [address], [place],  [place] [address]          Chief Complaint according to patient   Patient presents with:    . New Patient (Initial Visit)     pt alone, rm 11. presents today she has a hx of migraines but states that in 2018 is when they increased and became worse which is when she started fertility treatments. she has stage 3 kidney dx so limited on medications that can be taken. she has not tried and failed any prescription medications as a migraine preventative. states that she has used tylenol OTC but it doesnt help. can have as many as 10 migraines a month Last MRI 2018 and she has pituitary tumor      HISTORY OF PRESENT ILLNESS:  Tonya Wilson is a 39 year old African American female patient seen here upon referral on 02/26/2020 from Dr. Amil Amen for migraine/ headaches in increasing frequency.  Chief concern according to patient :  " I have the pleasure of meeting Mrs. Marengo today on 26 February 2020 she states that she has had irregular and sometimes very severe menstrual cycles occasionally with a lot of blood loss since at least 2012 she had moved to the [place] area from [place].  Here she underwent fertility treatments and recent work-up she was found to have high prolactin levels.  Following the high prolactin levels an MRI of the brain was ordered through the [facility] system, which confirmed the presence of an adenoma.  The patient has increasing frequency of migraines and nonmigrainous headaches and would like to be evaluated.   The MRI that confirmed a pituitary tumor was performed in 2018.  She is treated for hypertension with amlodipine and clonidine, which also helps her to sleep.  She is a former smoker, she does not drink alcohol, and she does not use caffeine.   I have the  pleasure of seeing [name] today, a right-handed [place] or [place] American female with a possible sleep disorder.  She has a  has a past medical history of Asthma, Headache, Hypertension (age 39), Infertility, female, Prematurity (1983), Seasonal allergies (child), Uterine leiomyoma (2018), and Uterine polyp (2018).. She sleeps just fine on CLONIDINE she stated.        Headaches have evolved - she began taking birth control pills to regulate her cycle in 2017-2018 , and she noted that headaches were alway worse 2-3 days prior to ovulation and again worsening 2-3 days over the time of menses.  Dr Rolin Barry is her physician. She had high blood pressure and had to come off Birth control. She had surgery in April for fibroidectomy,  But her headache did not change.  She feels as if she always has a tension or pressure , but sometimes sharp stabbing sensations for seconds on the left temple.  These will make her flush and tear up. Sometimes she feels as if cold ice water is poured over there head.  She had severe migraines since age 39 or 78.  She reports photophobia - can't drive at night. Eye sight was tested and is excellent. Never waking her up at night  sometimes wakes up with headaches.     Review of Systems: Out of a complete 14 system review, the patient complains of  only the following symptoms, and all other reviewed systems are negative.:   Patient has been discussing a hysterectomy now- she chose a myomectomy 50 days ago and is hoping to conceive.  She has only one kidney.     Headaches have evolved - she began taking birth control pills to regulate her cycle in 2017-2018 , and she noted that headaches were alway worse 2-3 days prior to ovulation. She had high blood pressure and had to come off Birth control. She had surgery in April for fibroidectomy,  But her headache did not change.  She feels as if she always has a tension or pressure , but sometimes sharp stabbing sensations for  seconds on the left temple.  These will make her flush and tear up. Sometimes she feels as if cold ice water is poured over there head.  She had severe migraines since age 39 or 32.  She reports photophobia - can't drive at night.  Eye sight was tested and is excellent. Never waking her up at night  soemtimes wakes up with headaches.    Social History   Socioeconomic History  . Marital status: Single    Spouse name: Not on file  . Number of children: 1  . Years of education: Not on file  . Highest education level: Associate degree: occupational, Hotel manager, or vocational program  Occupational History  . Not on file  Tobacco Use  . Smoking status: Former Smoker    Packs/day: 0.15    Types: Cigarettes  . Smokeless tobacco: Never Used  . Tobacco comment: Would like to table for now.  Vaping Use  . Vaping Use: Never used  Substance and Sexual Activity  . Alcohol use: Yes    Comment: occasional wine with dinner  . Drug use: No  . Sexual activity: Never  Other Topics Concern  . Not on file  Social History Narrative  . Not on file   Social Determinants of Health   Financial Resource Strain:   . Difficulty of Paying Living Expenses:   Food Insecurity:   . Worried About Charity fundraiser in the Last Year:   . Arboriculturist in the Last Year:   Transportation Needs:   . Film/video editor (Medical):   Marland Kitchen Lack of Transportation (Non-Medical):   Physical Activity:   . Days of Exercise per Week:   . Minutes of Exercise per Session:   Stress:   . Feeling of Stress :   Social Connections:   . Frequency of Communication with Friends and Family:   . Frequency of Social Gatherings with Friends and Family:   . Attends Religious Services:   . Active Member of Clubs or Organizations:   . Attends Archivist Meetings:   Marland Kitchen Marital Status:     Family History  Problem Relation Age of Onset  . Asthma Mother   . Hypertension Mother   . Thyroid disease Mother   .  Hypertension Sister   . Anxiety disorder Sister     Past Medical History:  Diagnosis Date  . Asthma    Since birth.  . Headache   . Hypertension age 39  . Infertility, female   . Prematurity 1983   delivered by C/S at 7.5 months gestation.  Ventilated for unknown period of time. Had left lung pneumothorax at 3 months with chest tube.  . Seasonal allergies child  . Uterine leiomyoma 2018   Removed 11/2016  . Uterine polyp 2018  Past Surgical History:  Procedure Laterality Date  . excision uterine leiomyoma  11/2016     Current Outpatient Medications on File Prior to Visit  Medication Sig Dispense Refill  . acetaminophen (TYLENOL) 500 MG tablet Take 1,000 mg by mouth every 6 (six) hours as needed. For pain      . amLODipine (NORVASC) 5 MG tablet Take 5 mg by mouth daily.    . cabergoline (DOSTINEX) 0.5 MG tablet 0.25 mg 2 (two) times a week.     . Cholecalciferol (VITAMIN D3) 50 MCG (2000 UT) TABS Take by mouth. 1 daily    . cloNIDine (CATAPRES) 0.1 MG tablet TAKE 2 TABLETS BY MOUTH THREE TIMES DAILY 180 tablet 11  . ferrous gluconate (FERGON) 324 MG tablet Take 324 mg by mouth daily with breakfast.    . JUNEL FE 1/20 1-20 MG-MCG tablet 1 tablet daily.     Marland Kitchen loratadine (CLARITIN) 10 MG tablet Take 1 tablet (10 mg total) by mouth daily. 30 tablet 11  . polyethylene glycol powder (MIRALAX) 17 GM/SCOOP powder 17 g in 8 ounces water or juice once to twice daily 578 g 11  . Prenatal 27-1 MG TABS TAKE 1 TABLET BY MOUTH DAILY 90 tablet 3   No current facility-administered medications on file prior to visit.    Allergies  Allergen Reactions  . Other Other (See Comments)    NO NSAIDS 2/2 CKD PER PT  . Flagyl [Metronidazole] Rash    Physical exam:  Today's Vitals   02/26/20 1408  BP: 107/79  Pulse: (!) 50  Weight: 145 lb (65.8 kg)  Height: 5' 2.5" (1.588 m)   Body mass index is 26.1 kg/m.   Wt Readings from Last 3 Encounters:  02/26/20 145 lb (65.8 kg)  12/25/19 146  lb (66.2 kg)  02/13/19 148 lb (67.1 kg)     Ht Readings from Last 3 Encounters:  02/26/20 5' 2.5" (1.588 m)  12/25/19 5\' 2"  (1.575 m)  02/13/19 5\' 2"  (1.575 m)      General: The patient is awake, alert and appears not in acute distress. The patient is well groomed. Head: Normocephalic, atraumatic. Neck is supple. Mallampati 2 tongue twitches, tremulous. exaggerated gag.  ,  neck circumference: 14.6 inches . Nasal airflow  patent.  Retrognathia is  seen.  Dental status:  Intact.  Cardiovascular:  Regular rate and cardiac rhythm by pulse,  without distended neck veins. Respiratory: Lungs are clear to auscultation.  Skin:  Without evidence of ankle edema, or rash. Trunk: The patient's posture is erect.   Neurologic exam : The patient is awake and alert, oriented to place and time.   Memory subjective described as intact.  Attention span & concentration ability appears normal.  Speech is fluent,  without  dysarthria, dysphonia or aphasia.  Mood and affect are appropriate.   Cranial nerves: no loss of smell or taste reported  Pupils are equal in size and round, and briskly reactive to light. Funduscopic exam - no palor , no edema.   Extraocular movements in vertical and horizontal planes were intact and without nystagmus. No Diplopia. No restrcitio of the peripheral field.  Visual fields by finger perimetry are intact. Hearing was intact to soft voice and finger rubbing. Facial sensation intact to fine touch.  Facial motor strength is symmetric and tongue and uvula move midline.  Neck ROM : rotation, tilt and flexion extension were normal for age and shoulder shrug was symmetrical.  Motor exam:  Symmetric bulk, tone  and ROM.   Normal tone without cog wheeling, symmetric grip strength  Sensory:  Fine touch, pinprick and vibration were normal.  Proprioception tested in the upper extremities was normal Coordination: Rapid alternating movements in the fingers/hands were of normal speed.    The Finger-to-nose maneuver was intact without evidence of ataxia, dysmetria or tremor Gait and station: Patient could rise unassisted from a seated position, walked without assistive device.  Stance is of normal width/ base and the patient turned with 3 steps.  Toe and heel walk were deferred.  Deep tendon reflexes: in the  upper and lower extremities are symmetric and intact.  Babinski response was deferred.    After spending a total time of 60 minutes face to face and additional time for physical and neurologic examination, review of laboratory studies,  Outside records, history - oral and family related-  personal review of imaging studies, reports and results of other testing and review of referral information / records as far as provided in visit, I have established the following assessments:  1)  History of co-incedentially found Pituitary adenoma, prolactinoma, treated on cabergoline  2)  Migraines since school age, since menarche at age 11.  54) Proteinuria and hypertension since age 38. She was a premature born baby, NICU for 3 month - cardiac and pulmonary and urinary problems.  HTN uncontrolled. clonidine has been the best tolerated and most effective BP medication. Dr. Scheryl Marten , [facility].   My Plan is to proceed with:  1) MRI with new size of pituitary tumor.   2) I want to use a migraine preventive. She still has 5-10 migraines a month, improved since HTN is better controlled. She cannot use triptans , Topiramate ( renal impairment) , asthma prevents  Betablocker.  We can use :  Emgality or Ajovy.   Please note that the patient is not vaccinated for COVID 19.   I would like to [address] Ros[address], [address],  [place] [address] for allowing me to meet with and to take care of this pleasant patient.  Progressive proteinuria over the last 18 month.   II plan to follow up either personally or through our NP within 90- 120 days month.   CC: I will share my notes with  PCP .    Labs were obtained through [facility] and her creatinine level was 1. 4/ from 12-10-2019.   Electronically signed by: Larey Seat, MD 02/26/2020 2:17 PM  [facility] Board certified by The [employer] of Sleep Medicine and Diplomate of the [employer] of Sleep Medicine. Board certified In Neurology through the [place], Fellow of the [employer] of Neurology. Medical Director of [facility].

## 2020-02-26 NOTE — Patient Instructions (Signed)
Galcanezumab injection What is this medicine? GALCANEZUMAB (gal ka NEZ ue mab) is used to prevent migraines and treat cluster headaches. This medicine may be used for other purposes; ask your health care provider or pharmacist if you have questions. COMMON BRAND NAME(S): Emgality What should I tell my health care provider before I take this medicine? They need to know if you have any of these conditions:  an unusual or allergic reaction to galcanezumab, other medicines, foods, dyes, or preservatives  pregnant or trying to get pregnant  breast-feeding How should I use this medicine? This medicine is for injection under the skin. You will be taught how to prepare and give this medicine. Use exactly as directed. Take your medicine at regular intervals. Do not take your medicine more often than directed. It is important that you put your used needles and syringes in a special sharps container. Do not put them in a trash can. If you do not have a sharps container, call your pharmacist or healthcare provider to get one. Talk to your pediatrician regarding the use of this medicine in children. Special care may be needed. Overdosage: If you think you have taken too much of this medicine contact a poison control center or emergency room at once. NOTE: This medicine is only for you. Do not share this medicine with others. What if I miss a dose? If you miss a dose, take it as soon as you can. If it is almost time for your next dose, take only that dose. Do not take double or extra doses. What may interact with this medicine? Interactions are not expected. This list may not describe all possible interactions. Give your health care provider a list of all the medicines, herbs, non-prescription drugs, or dietary supplements you use. Also tell them if you smoke, drink alcohol, or use illegal drugs. Some items may interact with your medicine. What should I watch for while using this medicine? Tell your  doctor or healthcare professional if your symptoms do not start to get better or if they get worse. What side effects may I notice from receiving this medicine? Side effects that you should report to your doctor or health care professional as soon as possible:  allergic reactions like skin rash, itching or hives, swelling of the face, lips, or tongue Side effects that usually do not require medical attention (report these to your doctor or health care professional if they continue or are bothersome):  pain, redness, or irritation at site where injected This list may not describe all possible side effects. Call your doctor for medical advice about side effects. You may report side effects to FDA at 1-800-FDA-1088. Where should I keep my medicine? Keep out of the reach of children. You will be instructed on how to store this medicine. Throw away any unused medicine after the expiration date on the label. NOTE: This sheet is a summary. It may not cover all possible information. If you have questions about this medicine, talk to your doctor, pharmacist, or health care provider.  2020 Elsevier/Gold Standard (2017-12-27 12:03:23)  

## 2020-02-27 ENCOUNTER — Telehealth: Payer: Self-pay

## 2020-02-27 DIAGNOSIS — G43101 Migraine with aura, not intractable, with status migrainosus: Secondary | ICD-10-CM

## 2020-02-27 DIAGNOSIS — R519 Headache, unspecified: Secondary | ICD-10-CM

## 2020-02-27 DIAGNOSIS — N979 Female infertility, unspecified: Secondary | ICD-10-CM

## 2020-02-27 DIAGNOSIS — D352 Benign neoplasm of pituitary gland: Secondary | ICD-10-CM

## 2020-02-27 DIAGNOSIS — R801 Persistent proteinuria, unspecified: Secondary | ICD-10-CM

## 2020-02-27 DIAGNOSIS — C751 Malignant neoplasm of pituitary gland: Secondary | ICD-10-CM

## 2020-02-27 LAB — COMPREHENSIVE METABOLIC PANEL
ALT: 9 IU/L (ref 0–32)
AST: 16 IU/L (ref 0–40)
Albumin/Globulin Ratio: 1.5 (ref 1.2–2.2)
Albumin: 4.8 g/dL (ref 3.8–4.8)
Alkaline Phosphatase: 54 IU/L (ref 48–121)
BUN/Creatinine Ratio: 9 (ref 9–23)
BUN: 14 mg/dL (ref 6–20)
Bilirubin Total: 0.4 mg/dL (ref 0.0–1.2)
CO2: 20 mmol/L (ref 20–29)
Calcium: 9.3 mg/dL (ref 8.7–10.2)
Chloride: 103 mmol/L (ref 96–106)
Creatinine, Ser: 1.61 mg/dL — ABNORMAL HIGH (ref 0.57–1.00)
GFR calc Af Amer: 47 mL/min/{1.73_m2} — ABNORMAL LOW (ref >59)
GFR calc non Af Amer: 41 mL/min/{1.73_m2} — ABNORMAL LOW (ref >59)
Globulin, Total: 3.2 g/dL (ref 1.5–4.5)
Glucose: 65 mg/dL (ref 65–99)
Potassium: 4 mmol/L (ref 3.5–5.2)
Sodium: 139 mmol/L (ref 134–144)
Total Protein: 8 g/dL (ref 6.0–8.5)

## 2020-02-27 NOTE — Progress Notes (Signed)
This creatinine level at 1.61 is too high to use contrast agent. There is no correlation with dehydration, the BUN is normal. We have to change the MRI order to a non-contrast study.

## 2020-02-27 NOTE — Telephone Encounter (Signed)
-----   Message from Larey Seat, MD sent at 02/27/2020  1:54 PM EDT ----- This creatinine level at 1.61 is too high to use contrast agent. There is no correlation with dehydration, the BUN is normal. We have to change the MRI order to a non-contrast study.

## 2020-02-27 NOTE — Telephone Encounter (Signed)
I called the pt and advised of lab results.  She verbalized understanding and is agreeable to MRI being completed with out contrast.  Order has been placed.

## 2020-03-03 ENCOUNTER — Telehealth: Payer: Self-pay | Admitting: Neurology

## 2020-03-03 NOTE — Telephone Encounter (Signed)
[employer] Auth: [id number] (exp. 03/03/20 to 9//821) order sent to GI. They will reach out to the patient to schedule.

## 2020-03-04 ENCOUNTER — Telehealth: Payer: Self-pay | Admitting: Neurology

## 2020-03-04 NOTE — Telephone Encounter (Signed)
Pt called stating that she is needing a PA for her Fremanezumab-vfrm (AJOVY) 225 MG/1.5ML SOSY sent to the [facility] on [address].

## 2020-03-05 NOTE — Telephone Encounter (Signed)
PA approved for the patient until. 02/04/2020;Coverage End Date:03/05/2021;

## 2020-03-05 NOTE — Telephone Encounter (Signed)
PA completed on cover my meds/express scripts. [id number] Will wait for response

## 2020-03-06 ENCOUNTER — Encounter: Payer: Self-pay | Admitting: Neurology

## 2020-03-09 ENCOUNTER — Encounter: Payer: Self-pay | Admitting: Neurology

## 2020-03-11 ENCOUNTER — Ambulatory Visit
Admission: RE | Admit: 2020-03-11 | Discharge: 2020-03-11 | Disposition: A | Discharge status: Home / Self Care | Payer: BC Managed Care – PPO | Source: Ambulatory Visit | Attending: Neurology | Admitting: Neurology

## 2020-03-11 DIAGNOSIS — G43101 Migraine with aura, not intractable, with status migrainosus: Secondary | ICD-10-CM

## 2020-03-11 DIAGNOSIS — D352 Benign neoplasm of pituitary gland: Secondary | ICD-10-CM

## 2020-03-11 DIAGNOSIS — R519 Headache, unspecified: Secondary | ICD-10-CM

## 2020-03-11 DIAGNOSIS — R801 Persistent proteinuria, unspecified: Secondary | ICD-10-CM

## 2020-03-11 DIAGNOSIS — C751 Malignant neoplasm of pituitary gland: Secondary | ICD-10-CM

## 2020-03-11 DIAGNOSIS — N979 Female infertility, unspecified: Secondary | ICD-10-CM

## 2020-03-13 NOTE — Progress Notes (Signed)
Normal pituitary gland- by this non contrast study there is no evidence of a tumor !

## 2020-03-16 ENCOUNTER — Encounter: Payer: Self-pay | Admitting: Neurology

## 2020-03-18 ENCOUNTER — Encounter: Payer: Self-pay | Admitting: Neurology

## 2020-03-18 ENCOUNTER — Telehealth: Payer: Self-pay | Admitting: Neurology

## 2020-03-18 NOTE — Telephone Encounter (Signed)
   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 Most are located in the anterior lobe (front portion) of the gland. About 1 in 10 people will develop a pituitary adenoma in their lifetime. Macrodenomas at more than 10 mm large, microadenomas are below that size.  many pituitary adenomas secrete one or more hormones in excess.  The most common is PROLACTIN-  The tumor can shrink when treated with bromocyptine, that is the goal.  There is a reason for not using MRI contrast- see high creatinine level.  We should just follow the prolactin levels under bromocriptine therapy-   Larey Seat, MD   Cc Dr Amil Amen,

## 2020-03-18 NOTE — Telephone Encounter (Signed)
Called the patient and discussed her MRI findings. Advised that due to non contrast MRI there was no evidence of tumor present. The patient was confused by this statement and is asking is this because of the imaging being non contrast. MRI in 2018 had suggested tumor to be present and she didn't think it would just disappear. The patient would like to repeat lab work after she has had time to hydrate and see if her kidney function will look good enough to completed contrast imaging. If insurance will allow she would like to attempt a repeat MRI with contrast. Advised I would bring this to Dr Dohmeier's attention and let her know what she recommends.

## 2020-03-18 NOTE — Telephone Encounter (Signed)
-----   Message from Larey Seat, MD sent at 03/13/2020  1:26 PM EDT ----- Normal pituitary gland- by this non contrast study there is no evidence of a tumor !

## 2020-04-02 ENCOUNTER — Telehealth: Payer: Self-pay

## 2020-04-02 NOTE — Telephone Encounter (Signed)
Patient called asking for referral to kidney specialist here in [place]. Patient states she was seeing someone at [facility] but she prefers to see someone here local.  To Dr. Amil Amen for further direction.

## 2020-04-09 NOTE — Telephone Encounter (Signed)
Did you ever find out if she is planning to stay at [facility] for infertility?  Would be good to stay where they can communicate more easily until done with fertility issues

## 2020-04-14 NOTE — Telephone Encounter (Signed)
Spoke with patient. States her insurance changed and dropped [facility] specialist in the middle of her fertility treatment so she now has to see someone here in [place]. States it is okay to refer her to kidney specialist here.

## 2020-04-14 NOTE — Telephone Encounter (Signed)
Left message for patient toc all back with what her plan is with fertility

## 2020-05-22 ENCOUNTER — Telehealth: Payer: Self-pay | Admitting: Internal Medicine

## 2020-05-22 DIAGNOSIS — N979 Female infertility, unspecified: Secondary | ICD-10-CM

## 2020-05-22 DIAGNOSIS — N183 Chronic kidney disease, stage 3 unspecified: Secondary | ICD-10-CM

## 2020-05-22 NOTE — Telephone Encounter (Signed)
Routing to Dr. Amil Amen to place the order

## 2020-05-28 ENCOUNTER — Ambulatory Visit (INDEPENDENT_AMBULATORY_CARE_PROVIDER_SITE_OTHER): Payer: BC Managed Care – PPO | Admitting: Family Medicine

## 2020-05-28 ENCOUNTER — Encounter: Payer: Self-pay | Admitting: Family Medicine

## 2020-05-28 ENCOUNTER — Other Ambulatory Visit: Payer: Self-pay

## 2020-05-28 VITALS — BP 113/76 | HR 82 | Ht 62.0 in | Wt 153.0 lb

## 2020-05-28 DIAGNOSIS — D352 Benign neoplasm of pituitary gland: Secondary | ICD-10-CM | POA: Insufficient documentation

## 2020-05-28 DIAGNOSIS — G43101 Migraine with aura, not intractable, with status migrainosus: Secondary | ICD-10-CM

## 2020-05-28 MED ORDER — EMGALITY 120 MG/ML ~~LOC~~ SOAJ: 120.0000 mg | SUBCUTANEOUS | 11 refills | Status: DC

## 2020-05-28 MED ORDER — UBRELVY 100 MG PO TABS: 100.0000 mg | ORAL_TABLET | Freq: Every day | ORAL | 11 refills | Status: DC | PRN

## 2020-05-28 NOTE — Progress Notes (Signed)
Chief Complaint  Patient presents with  . Follow-up    rm 6  . Migraine    pt said she has no new sx     HISTORY OF PRESENT ILLNESS: Today 05/28/20  Tonya Wilson is a 39 y.o. female here today for follow up for headaches and pituitary adenoma on cabergoline. Last MRI w/o contrast 02/2020 did not show evidence of tumor. She was started on Ajovy for migraine prevention. She has taken 3 injections and does not feel that it has helped at all. She continues to have about 6-10 headache days per month. Most are throbbing in nature. Some sharp and stabbing headaches. She can not take triptans or Nsaids due to kidney disease. She is using Tylenol but does not feel it helps. She admits that stress levels are most likely contributor outside of menstrual migraines. 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 She is sleeping well. She had a normal eye exam. She is thinking about getting blue light glasses as she spends a lot of time on her computer.    HISTORY (copied from Dr Dohmeier's note on 02/26/2020)  Tonya Stewartis a 39  year old African American female patientseen here upon referralon 02/26/2020 from Dr. Amil Amen for migraine/ headaches in increasing frequency.  Chiefconcernaccording to patient :  " I have the pleasure of meeting Mrs. Minella today on 26 February 2020 she states that she has had irregular and sometimes very severe menstrual cycles occasionally with a lot of blood loss since at least 2012 she had moved to the [place] area from [place].  Here she underwent fertility treatments and recent work-up she was found to have high prolactin levels.  Following the high prolactin levels an MRI of the brain was ordered through the [facility] system, which confirmed the presence of an adenoma.  The patient has increasing frequency of migraines and nonmigrainous headaches and would like to be evaluated.   The MRI that confirmed a pituitary tumor was performed in 2018.  She is  treated for hypertension with amlodipine and clonidine, which also helps her to sleep.  She is a former smoker, she does not drink alcohol, and she does not use caffeine.  I have the pleasure of seeing Tonya Wilson today,a right-handed [place] or [place] American female with a possible sleep disorder. She has a  has a past medical history of Asthma, Headache, Hypertension (age 37), Infertility, female, Prematurity (1983), Seasonal allergies (child), Uterine leiomyoma (2018), and Uterine polyp (2018).. She sleeps just fine on CLONIDINE she stated.   Headaches have evolved - she began taking birth control pills to regulate her cycle in 2017-2018 , and she noted that headaches were alway worse 2-3 days prior to ovulation and again worsening 2-3 days over the time of menses.  Dr Rolin Barry is her physician. She had high blood pressure and had to come off Birth control. She had surgery in April for fibroidectomy,  But her headache did not change.  She feels as if she always has a tension or pressure , but sometimes sharp stabbing sensations for seconds on the left temple.  These will make her flush and tear up. Sometimes she feels as if cold ice water is poured over there head.  She had severe migraines since age 4 or 79.  She reports photophobia - can't drive at night. Eye sight was tested and is excellent. Never waking her up at night  sometimes wakes up with headaches.  REVIEW OF SYSTEMS: Out of a complete 14 system review of symptoms, the patient complains only of the following symptoms, headaches, anxiety and all other reviewed systems are negative.   ALLERGIES: Allergies  Allergen Reactions  . Other Other (See Comments)    NO NSAIDS 2/2 CKD PER PT  . Flagyl [Metronidazole] Rash     HOME MEDICATIONS: Outpatient Medications Prior to Visit  Medication Sig Dispense Refill  . acetaminophen (TYLENOL) 500 MG tablet Take 1,000 mg by mouth every 6 (six) hours as needed. For pain      .  amLODipine (NORVASC) 5 MG tablet Take 5 mg by mouth daily.    . cabergoline (DOSTINEX) 0.5 MG tablet 0.25 mg 2 (two) times a week.     . Cholecalciferol (VITAMIN D3) 50 MCG (2000 UT) TABS Take by mouth. 1 daily    . cloNIDine (CATAPRES) 0.1 MG tablet TAKE 2 TABLETS BY MOUTH THREE TIMES DAILY 180 tablet 11  . JUNEL FE 1/20 1-20 MG-MCG tablet 1 tablet daily.     . polyethylene glycol powder (MIRALAX) 17 GM/SCOOP powder 17 g in 8 ounces water or juice once to twice daily 578 g 11  . Prenatal 27-1 MG TABS TAKE 1 TABLET BY MOUTH DAILY 90 tablet 3  . ferrous gluconate (FERGON) 324 MG tablet Take 324 mg by mouth daily with breakfast.    . Fremanezumab-vfrm (AJOVY) 225 MG/1.5ML SOAJ Inject 225 mg into the skin every 30 (thirty) days. 1 pen 5  . Fremanezumab-vfrm (AJOVY) 225 MG/1.5ML SOSY Only 1 pen was given the other sample pen was a dud. 3 mL 0  . loratadine (CLARITIN) 10 MG tablet Take 1 tablet (10 mg total) by mouth daily. 30 tablet 11   No facility-administered medications prior to visit.     PAST MEDICAL HISTORY: Past Medical History:  Diagnosis Date  . Asthma    Since birth.  . Headache   . Hypertension age 64  . Infertility, female   . Prematurity 1983   delivered by C/S at 7.5 months gestation.  Ventilated for unknown period of time. Had left lung pneumothorax at 3 months with chest tube.  . Seasonal allergies child  . Uterine leiomyoma 2018   Removed 11/2016  . Uterine polyp 2018     PAST SURGICAL HISTORY: Past Surgical History:  Procedure Laterality Date  . excision uterine leiomyoma  11/2016     FAMILY HISTORY: Family History  Problem Relation Age of Onset  . Asthma Mother   . Hypertension Mother   . Thyroid disease Mother   . Hypertension Sister   . Anxiety disorder Sister      SOCIAL HISTORY: Social History   Socioeconomic History  . Marital status: Single    Spouse name: Not on file  . Number of children: 1  . Years of education: Not on file  . Highest  education level: Associate degree: occupational, Hotel manager, or vocational program  Occupational History  . Not on file  Tobacco Use  . Smoking status: Former Smoker    Packs/day: 0.15    Types: Cigarettes  . Smokeless tobacco: Never Used  . Tobacco comment: Would like to table for now.  Vaping Use  . Vaping Use: Never used  Substance and Sexual Activity  . Alcohol use: Yes    Comment: occasional wine with dinner  . Drug use: No  . Sexual activity: Never  Other Topics Concern  . Not on file  Social History Narrative  . Not on file  Social Determinants of Health   Financial Resource Strain:   . Difficulty of Paying Living Expenses: Not on file  Food Insecurity:   . Worried About Charity fundraiser in the Last Year: Not on file  . Ran Out of Food in the Last Year: Not on file  Transportation Needs:   . Lack of Transportation (Medical): Not on file  . Lack of Transportation (Non-Medical): Not on file  Physical Activity:   . Days of Exercise per Week: Not on file  . Minutes of Exercise per Session: Not on file  Stress:   . Feeling of Stress : Not on file  Social Connections:   . Frequency of Communication with Friends and Family: Not on file  . Frequency of Social Gatherings with Friends and Family: Not on file  . Attends Religious Services: Not on file  . Active Member of Clubs or Organizations: Not on file  . Attends Archivist Meetings: Not on file  . Marital Status: Not on file  Intimate Partner Violence:   . Fear of Current or Ex-Partner: Not on file  . Emotionally Abused: Not on file  . Physically Abused: Not on file  . Sexually Abused: Not on file      PHYSICAL EXAM  Vitals:   05/28/20 0955  BP: 113/76  Pulse: 82  Weight: 153 lb (69.4 kg)  Height: 5\' 2"  (1.575 m)   Body mass index is 27.98 kg/m.   Generalized: Well developed, in no acute distress   Cardiology: Normal rate and rhythm, no murmur auscultated Respiratory: Clear to  auscultation bilaterally  Neurological examination  Mentation: Alert oriented to time, place, history taking. Follows all commands speech and language fluent Cranial nerve II-XII: Pupils were equal round reactive to light. Extraocular movements were full, visual field were full . Motor: The motor testing reveals 5 over 5 strength of all 4 extremities. Good symmetric motor tone is noted throughout.  Gait and station: Gait is normal.     DIAGNOSTIC DATA (LABS, IMAGING, TESTING) - I reviewed patient records, labs, notes, testing and imaging myself where available.  Lab Results  Component Value Date   WBC 7.7 06/26/2011   HGB 10.1 (L) 06/26/2011   HCT 30.9 (L) 06/26/2011   MCV 84.7 06/26/2011   PLT 288 06/26/2011      Component Value Date/Time   NA 139 02/26/2020 1532   K 4.0 02/26/2020 1532   CL 103 02/26/2020 1532   CO2 20 02/26/2020 1532   GLUCOSE 65 02/26/2020 1532   GLUCOSE 79 06/26/2011 0827   BUN 14 02/26/2020 1532   CREATININE 1.61 (H) 02/26/2020 1532   CALCIUM 9.3 02/26/2020 1532   PROT 8.0 02/26/2020 1532   ALBUMIN 4.8 02/26/2020 1532   AST 16 02/26/2020 1532   ALT 9 02/26/2020 1532   ALKPHOS 54 02/26/2020 1532   BILITOT 0.4 02/26/2020 1532   GFRNONAA 41 (L) 02/26/2020 1532   GFRAA 47 (L) 02/26/2020 1532   No results found for: CHOL, HDL, LDLCALC, LDLDIRECT, TRIG, CHOLHDL No results found for: HGBA1C No results found for: VITAMINB12 No results found for: TSH    ASSESSMENT AND PLAN  39 y.o. year old female  has a past medical history of Asthma, Headache, Hypertension (age 77), Infertility, female, Prematurity (1983), Seasonal allergies (child), Uterine leiomyoma (2018), and Uterine polyp (2018). here with   Pituitary adenoma ([place])  Migraine with aura and with status migrainosus, not intractable - Plan: Galcanezumab-gnlm (EMGALITY) 120 MG/ML SOAJ, Ubrogepant (UBRELVY)  100 MG TABS   Ms. Star continues to have frequent headaches with some migrainous in  cluster-like features. Ajovy has not offered any benefit. We will switch to Emgality injections every 30 days. I will also provide Ubrelvy for abortive therapy. She was educated on appropriate administration and possible side effects of these medications. I have encouraged her to continue close follow-up with her care team. I do suspect that stress is contributing. Fortunately MRI is reassuring. Healthy lifestyle habits encouraged. She will follow-up with [place] in 6 months, sooner if needed. She verbalizes understanding and agreement with this plan.  I spent 20 minutes of face-to-face and non-face-to-face time with patient.  This included previsit chart review, lab review, study review, order entry, electronic health record documentation, patient education.    Debbora Presto, MSN, FNP-C 05/28/2020, 10:27 AM  [facility] [address], [place], [place] [address] [phone]

## 2020-05-28 NOTE — Patient Instructions (Signed)
Below is our plan:  We will stop Ajovy as this has not been effective. We will start Emgality injections every 30 days. I am hopeful this will help with both throbbing and sharp headaches. I will also call in Ubrelvy for abortive therapy. Take 1 tablet at onset of migraine, may repeat 1 tablet in 2 hours but no more than 2 in 24 hours or 10 per month.   Please make sure you are staying well hydrated. I recommend 50-60 ounces daily. Well balanced diet and regular exercise encouraged.    Please continue follow up with care team as directed.   Follow up in 6 months.   You may receive a survey regarding today's visit. I encourage you to leave honest feed back as I do use this information to improve patient care. Thank you for seeing me today!      Pituitary Tumors  Pituitary tumors are abnormal growths found in the pituitary gland. The pituitary gland is a small organ in the center of the brain. It makes hormones that affect growth and the functions of other glands in the body. In most cases, pituitary tumors grow slowly, are not cancerous (benign), and do not spread to other parts of the body. These tumors are best treated when they are found and diagnosed early. A pituitary tumor may produce hormones (functioning tumor) or not (non-functioning tumor). A pituitary tumor may cause:  Cushing disease. In this disease, the pituitary gland produces too much of a hormone called cortisol. This causes fat to build up in the face, back, and chest while the arms and legs become thin.  Acromegaly. This is a condition in which the hands, feet, and face are larger than normal.  Breast milk production, even when there is no pregnancy. What are the causes? The cause of most pituitary tumors is not known. In some cases, pituitary tumors may be passed from parent to child (inherited). What increases the risk? You are more likely to develop this condition if:  You have a family history of pituitary  tumors.  You have certain syndromes caused by unwanted changes (mutations) in your genes. What are the signs or symptoms? Symptoms of this condition include:  Headaches.  Loss of consciousness.  Vision problems and eye muscle weakness.  Weakness or low energy.  Clear fluid draining from the nose.  Changes in the sense of smell.  Loss of body hair.  Nausea and vomiting.  Problems caused by the production of too many hormones, such as: ? Inability to get pregnant after a year of having sex regularly without using birth control (infertility). ? Loss of menstrual periods in women. ? Abnormal growth. ? Diabetes insipidus or diabetes mellitus. ? High blood pressure (hypertension). ? Inability to tolerate heat or cold. ? Increase in sweating. ? Joint pain. ? Other skin and body changes. ? Nipple discharge. ? Changes in mood, or depression. ? Decreased sexual function. How is this diagnosed? This condition may be diagnosed based on:  Your symptoms.  Your medical history.  Blood or urine tests to check your hormone levels.  CT scan.  MRI.  Removal and examination of a small tumor tissue (biopsy). How is this treated? Treatment depends on the type of pituitary tumor you have and your overall health. Treatments may include:  Surgical removal of the tumor.  Using high doses of X-ray energy to kill tumor cells (radiation).  Using certain medicines to stop the pituitary gland from producing too many hormones (drug therapy). If  you have a family history of pituitary tumors, you may need to have regular blood tests to monitor pituitary hormone levels. Follow these instructions at home:  Drink enough fluid to keep your urine clear or pale yellow.  If directed, follow instructions from your health care provider about measuring how much urine you pass.  Do not pick your nose or remove any crusting, if your nose is draining clear fluid. Tell your health care provider if  this condition worsens.  Do not do any activities that require straining, such as heavy lifting. Ask your health care provider what activities are safe for you.  Take over-the-counter and prescription medicines only as told by your health care provider.  Keep all follow-up visits as told by your health care provider. This is important, especially if you have a family history of pituitary tumors and you need regular blood tests. Contact a health care provider if:  You have sudden, unusual thirst.  You are urinating more often than usual.  You have a headache that does not go away.  You develop new changes in your vision.  You have clear fluid leaking from your nose or ears.  You have a sensation of fluid trickling down the back of your throat.  You have a salty taste in your mouth.  You have trouble concentrating. Get help right away if:  Your symptoms suddenly become severe.  You have a nosebleed that does not stop after a few minutes.  You have a fever of over 101F (38.3C).  You have a severe headache.  You have a stiff neck.  You are confused or not as alert as usual.  You have chest pain.  You have shortness of breath. Summary  Pituitary tumors are abnormal growths found in the pituitary gland.  Treatment depends on the type of pituitary tumor you have and your overall health.  Keep all follow-up visits as told by your health care provider. This is important, especially if you have a family history of pituitary tumors and you need regular blood tests. This information is not intended to replace advice given to you by your health care provider. Make sure you discuss any questions you have with your health care provider. Document Revised: 12/24/2018 Document Reviewed: 09/27/2016 Elsevier Patient Education  [place].    Cluster Headache Cluster headaches are deeply painful. They normally occur on one side of your head, but they may switch sides.  Often, cluster headaches:  Are severe.  Happen often for a few weeks or months and then go away for a while.  Last from 15 minutes to 3 hours.  Happen at the same time each day.  Happen at night.  Happen many times a day. Follow these instructions at home:        Follow instructions from your doctor to care for yourself at home:  Go to bed at the same time each night. Get the same amount of sleep every night.  Avoid alcohol.  Stop smoking if you smoke. This includes cigarettes and e-cigarettes.  Take over-the-counter and prescription medicines only as told by your doctor.  Do not drive or use heavy machinery while taking prescription pain medicine.  Use oxygen as told by your doctor.  Exercise regularly.  Eat a healthy diet.  Write down when each headache happened, what kind of pain you had, how bad your pain was, and what you tried to help your pain. This is called a headache diary. Use it as told by  your doctor. Contact a doctor if:  Your headaches get worse or they happen more often.  Your medicines are not helping. Get help right away if:  You pass out (faint).  You get weak or lose feeling (have numbness) on one side of your body or face.  You see two of everything (double vision).  You feel sick to your stomach (nauseous) or you throw up (vomit), and you do not stop after many hours.  You have trouble with your balance or with walking.  You have trouble talking.  You have neck pain or stiffness.  You have a fever. This information is not intended to replace advice given to you by your health care provider. Make sure you discuss any questions you have with your health care provider. Document Revised: 05/08/2019 Document Reviewed: 03/18/2016 Elsevier Patient Education  [place].   Migraine Headache A migraine headache is a very strong throbbing pain on one side or both sides of your head. This type of headache can also cause other  symptoms. It can last from 4 hours to 3 days. Talk with your doctor about what things may bring on (trigger) this condition. What are the causes? The exact cause of this condition is not known. This condition may be triggered or caused by:  Drinking alcohol.  Smoking.  Taking medicines, such as: ? Medicine used to treat chest pain (nitroglycerin). ? Birth control pills. ? Estrogen. ? Some blood pressure medicines.  Eating or drinking certain products.  Doing physical activity. Other things that may trigger a migraine headache include:  Having a menstrual period.  Pregnancy.  Hunger.  Stress.  Not getting enough sleep or getting too much sleep.  Weather changes.  Tiredness (fatigue). What increases the risk?  Being 43-21 years old.  Being female.  Having a family history of migraine headaches.  Being Caucasian.  Having depression or anxiety.  Being very overweight. What are the signs or symptoms?  A throbbing pain. This pain may: ? Happen in any area of the head, such as on one side or both sides. ? Make it hard to do daily activities. ? Get worse with physical activity. ? Get worse around bright lights or loud noises.  Other symptoms may include: ? Feeling sick to your stomach (nauseous). ? Vomiting. ? Dizziness. ? Being sensitive to bright lights, loud noises, or smells.  Before you get a migraine headache, you may get warning signs (an aura). An aura may include: ? Seeing flashing lights or having blind spots. ? Seeing bright spots, halos, or zigzag lines. ? Having tunnel vision or blurred vision. ? Having numbness or a tingling feeling. ? Having trouble talking. ? Having weak muscles.  Some people have symptoms after a migraine headache (postdromal phase), such as: ? Tiredness. ? Trouble thinking (concentrating). How is this treated?  Taking medicines that: ? Relieve pain. ? Relieve the feeling of being sick to your stomach. ? Prevent  migraine headaches.  Treatment may also include: ? Having acupuncture. ? Avoiding foods that bring on migraine headaches. ? Learning ways to control your body functions (biofeedback). ? Therapy to help you know and deal with negative thoughts (cognitive behavioral therapy). Follow these instructions at home: Medicines  Take over-the-counter and prescription medicines only as told by your doctor.  Ask your doctor if the medicine prescribed to you: ? Requires you to avoid driving or using heavy machinery. ? Can cause trouble pooping (constipation). You may need to take these steps to prevent  or treat trouble pooping:  Drink enough fluid to keep your pee (urine) pale yellow.  Take over-the-counter or prescription medicines.  Eat foods that are high in fiber. These include beans, whole grains, and fresh fruits and vegetables.  Limit foods that are high in fat and sugar. These include fried or sweet foods. Lifestyle  Do not drink alcohol.  Do not use any products that contain nicotine or tobacco, such as cigarettes, e-cigarettes, and chewing tobacco. If you need help quitting, ask your doctor.  Get at least 8 hours of sleep every night.  Limit and deal with stress. General instructions      Keep a journal to find out what may bring on your migraine headaches. For example, write down: ? What you eat and drink. ? How much sleep you get. ? Any change in what you eat or drink. ? Any change in your medicines.  If you have a migraine headache: ? Avoid things that make your symptoms worse, such as bright lights. ? It may help to lie down in a dark, quiet room. ? Do not drive or use heavy machinery. ? Ask your doctor what activities are safe for you.  Keep all follow-up visits as told by your doctor. This is important. Contact a doctor if:  You get a migraine headache that is different or worse than others you have had.  You have more than 15 headache days in one month. Get  help right away if:  Your migraine headache gets very bad.  Your migraine headache lasts longer than 72 hours.  You have a fever.  You have a stiff neck.  You have trouble seeing.  Your muscles feel weak or like you cannot control them.  You start to lose your balance a lot.  You start to have trouble walking.  You pass out (faint).  You have a seizure. Summary  A migraine headache is a very strong throbbing pain on one side or both sides of your head. These headaches can also cause other symptoms.  This condition may be treated with medicines and changes to your lifestyle.  Keep a journal to find out what may bring on your migraine headaches.  Contact a doctor if you get a migraine headache that is different or worse than others you have had.  Contact your doctor if you have more than 15 headache days in a month. This information is not intended to replace advice given to you by your health care provider. Make sure you discuss any questions you have with your health care provider. Document Revised: 11/02/2018 Document Reviewed: 08/23/2018 Elsevier Patient Education  [place].

## 2020-05-29 ENCOUNTER — Telehealth: Payer: Self-pay | Admitting: Family Medicine

## 2020-05-29 DIAGNOSIS — G43101 Migraine with aura, not intractable, with status migrainosus: Secondary | ICD-10-CM

## 2020-05-29 NOTE — Telephone Encounter (Signed)
Pt. States pharmacy informed him that  Ubrogepant (UBRELVY) 100 MG TABS has not been sent in & they need a PA for  Galcanezumab-gnlm [facility]) 120 MG/ML [place]: [place] [phone]

## 2020-06-01 NOTE — Telephone Encounter (Signed)
LMVM for pt to let her know that received message. Prescription for ubrelvy sent in 05-28-20 and PA will be worked on as in [employer].

## 2020-06-01 NOTE — Progress Notes (Signed)
SWITCH TO [facility] ON [address] NOT [id number]  PA submitted for Emgality 300mg  on Cover my Meds. There is a 24/72hr turn around. PA ID #: [id number] Key: [id number] Status: PENDING  Per. Dr Dohemier's note:  My Plan is to proceed with:  1) MRI with new size of pituitary tumor.   2) I want to use a migraine preventive. She still has 5-10 migraines a month, improved since HTN is better controlled. She cannot use triptans , Topiramate ( renal impairment) , asthma prevents  Betablocker.  We can use :  Emgality or Ajovy.  --- [facility] is reviewing your PA request and will respond within 24 hours for Medicaid or up to 72 hours for non-Medicaid plans, based on the required timeframe determined by state or federal regulations. To check for an update later, open this request from your dashboard.

## 2020-06-01 NOTE — Telephone Encounter (Signed)
Tommi Rumps script to [place] on 05-28-20, emgality in work que for [employer]

## 2020-06-01 NOTE — Progress Notes (Signed)
Submitted PA for [employer] on Cover my Meds. Key: [id number]  PA Case ID: [id number]  Rx #: [id number]   Status: Approved  Prior Auth: Coverage Start Date:05/02/2020; Coverage End Date: 06/01/2021  Pt and Pharmacy was contacted and notified of the approval

## 2020-06-01 NOTE — Telephone Encounter (Signed)
Pt. States she does not use the [address] at all, as her insurance is with [employer]. She is requesting for  Ubrogepant (UBRELVY) 100 MG TABS to be sent to [place] #[id number].

## 2020-06-02 MED ORDER — UBRELVY 100 MG PO TABS: 100.0000 mg | ORAL_TABLET | Freq: Every day | ORAL | 11 refills | Status: DC | PRN

## 2020-06-02 NOTE — Addendum Note (Signed)
Addended by: Brandon Melnick on: 06/02/2020 10:46 AM   Modules accepted: Orders

## 2020-06-02 NOTE — Telephone Encounter (Signed)
I refilled at [facility] cancelled at [place] per pt request. LMVM to cancel at [place]. [phone]

## 2020-06-03 NOTE — Progress Notes (Addendum)
Tonya Wilson (Key: [facility])  This request has received an Unfavorable outcome.  An eAppeal may be available. View the bottom of the request to see if an eAppeal is available along with any additional information provided by [facility].  Submitted PA for [employer] on Cover my Meds. KeyDarrin Nipper  PA Case ID: [id number]  Rx #: [id number]   Status:  Denied

## 2020-06-04 ENCOUNTER — Telehealth: Payer: Self-pay | Admitting: Family Medicine

## 2020-06-04 DIAGNOSIS — G43101 Migraine with aura, not intractable, with status migrainosus: Secondary | ICD-10-CM

## 2020-06-04 NOTE — Telephone Encounter (Signed)
Pt has called to report her insurance company faxed a document to the office about her Emgality.  Pt is calling asking for the status of the approval of her emgality

## 2020-06-04 NOTE — Telephone Encounter (Addendum)
I called pt and relayed that denial was received for her emgality.  I donot have the reason why.  I called and spoke to JImmy attempted to do a verbal was was not able to complete and asked for him to fax to me (concerning specific medications she has tried.  I spoke to pt again and she has tried nifedipine, lisinopril, clonidine, amlodipine. I redid some questions on form and need signature to fax back. Amy is out of office today.

## 2020-06-08 ENCOUNTER — Other Ambulatory Visit: Payer: Self-pay | Admitting: *Deleted

## 2020-06-08 MED ORDER — EMGALITY 120 MG/ML ~~LOC~~ SOSY: 120.0000 mg | PREFILLED_SYRINGE | SUBCUTANEOUS | 11 refills | Status: DC

## 2020-06-08 NOTE — Telephone Encounter (Signed)
Case # [id number] Pt TG#[id number] 06-04-20 initiated decision made 06-08-20 Approved 05-05-20 thru 06-08-2021. Emgality 120mg /ml syringe.  [facility] [phone].

## 2020-06-08 NOTE — Telephone Encounter (Signed)
Spoke to pt and relayed that approval is for emgality 120mg  /ml syringe.  She is ok to do this. (has sister who can do injection).  Relayed she can also look on website for instructions/ or get in the medication box too. She verbalized understanding.  Fax to [employer].

## 2020-06-17 NOTE — Telephone Encounter (Signed)
Needs referrals to infertility clinic and [facility] and insurance does not cover [facility] any longer.

## 2020-06-23 DIAGNOSIS — I129 Hypertensive chronic kidney disease with stage 1 through stage 4 chronic kidney disease, or unspecified chronic kidney disease: Secondary | ICD-10-CM | POA: Diagnosis not present

## 2020-06-23 DIAGNOSIS — R760 Raised antibody titer: Secondary | ICD-10-CM | POA: Diagnosis not present

## 2020-06-23 DIAGNOSIS — N1831 Chronic kidney disease, stage 3a: Secondary | ICD-10-CM | POA: Diagnosis not present

## 2020-06-23 DIAGNOSIS — D352 Benign neoplasm of pituitary gland: Secondary | ICD-10-CM | POA: Diagnosis not present

## 2020-06-24 ENCOUNTER — Other Ambulatory Visit: Payer: Self-pay

## 2020-06-24 ENCOUNTER — Ambulatory Visit (INDEPENDENT_AMBULATORY_CARE_PROVIDER_SITE_OTHER): Payer: BC Managed Care – PPO | Admitting: Internal Medicine

## 2020-06-24 VITALS — BP 125/86 | HR 56 | Resp 12 | Ht 62.0 in | Wt 150.0 lb

## 2020-06-24 DIAGNOSIS — D352 Benign neoplasm of pituitary gland: Secondary | ICD-10-CM | POA: Diagnosis not present

## 2020-06-24 DIAGNOSIS — N183 Chronic kidney disease, stage 3 unspecified: Secondary | ICD-10-CM | POA: Diagnosis not present

## 2020-06-24 DIAGNOSIS — I10 Essential (primary) hypertension: Secondary | ICD-10-CM

## 2020-06-24 DIAGNOSIS — Z800 Family history of malignant neoplasm of digestive organs: Secondary | ICD-10-CM | POA: Diagnosis not present

## 2020-06-24 NOTE — Progress Notes (Signed)
Subjective:    Patient ID: Tonya Wilson, female   DOB: March 04, 1981, 39 y.o.   MRN: [id number]   HPI   1.  Infertility:  Her insurance no longer covers Fertility treatments.  She did get the myomectomy and was covered, however.  This is why she is changing to all of providers here in [place].  2.  Migraines:  Just started on Emgality with Neuro/Dr. Dohmeier here in [place].   She has not yet noted a change in frequency or severity as just got started.    3.  CKD:  Just saw [facility] yesterday via telemedicine.  She will transition to [facility] in [place] in 8 months.    4.  Pituitary Adenoma:  She is not certain if Dr. Brett Fairy will take over care of this issue.  Previously, followed by Fertility, who also covered endocrine issues.  MR did not show an adenoma.  She was told to stay on Cabergoline. Last prolactin level Jan 2020 and in normal range.  5.  Family history of colon cancer and adenomatous polyps:  Maternal grandfather diagnosed at age 96 with colon cancer and died 2 years later of same.  Maternal aunt diagnosed at 48 of colon cancer.  Sister diagnosed at age 42 with adenomatous polyps.    Current Meds  Medication Sig  . acetaminophen (TYLENOL) 500 MG tablet Take 1,000 mg by mouth every 6 (six) hours as needed. For pain    . amLODipine (NORVASC) 5 MG tablet Take 5 mg by mouth daily.  . cabergoline (DOSTINEX) 0.5 MG tablet 0.25 mg 2 (two) times a week.   . Cholecalciferol (VITAMIN D3) 50 MCG (2000 UT) TABS Take by mouth. 1 daily  . cloNIDine (CATAPRES) 0.1 MG tablet TAKE 2 TABLETS BY MOUTH THREE TIMES DAILY  . Galcanezumab-gnlm (EMGALITY) 120 MG/ML [place] Inject 120 mg into the skin every 30 (thirty) days.  . polyethylene glycol powder (MIRALAX) 17 GM/SCOOP powder 17 g in 8 ounces water or juice once to twice daily  . Prenatal 27-1 MG TABS TAKE 1 TABLET BY MOUTH DAILY  . Ubrogepant (UBRELVY) 100 MG TABS Take 100 mg by mouth daily as needed. Take one tablet  at onset of headache, may repeat 1 tablet in 2 hours, no more than 2 tablets in 24 hours    Allergies  Allergen Reactions  . Other Other (See Comments)    NO NSAIDS 2/2 CKD PER PT  . Flagyl [Metronidazole] Rash     Review of Systems    Objective:   BP 125/86 (BP Location: Right Arm, Patient Position: Sitting, Cuff Size: Normal)   Pulse (!) 56   Resp 12   Ht 5\' 2"  (1.575 m)   Wt 150 lb (68 kg)   LMP 06/04/2020   BMI 27.44 kg/m   Physical Exam NAD HEENT:  PERRL, EOMI, visual fields full to confrontation Neck:  Supple, No adenopathy, no thyromegaly Chest:  CTA CV: RRR with normal S1 and S2, No S3, S4 or murmur.  No carotid bruits.  Carotid, radial and DP pulses normal and equal Abd:  S, NT, No HSM or mass, + BS LE:  No edema.  Assessment & Plan  1.  Infertility:  Patient also switching care to [facility].  2.  CKD:  [facility] to take over care shortly.   CMP, CBC, Magnesium, Phosphorus, UA/micro  3.  Seizure Disorder/pituitary microadenoma:  As per Dr. Brett Fairy for former.  Not clear if she will follow for pituitary  adenoma or if fertility provider will.  If not, referral to endocrine.  Prolactin level.  Continue Cabergoline.    4.  Family history of colon cancer with younger sister, age 75 with diagnosed adenomatous polyps.  Referral to GI, Dr. Ardis Hughs  5.  Hypertension:  Controlled.

## 2020-06-25 LAB — CBC WITH DIFFERENTIAL/PLATELET
Basophils Absolute: 0 10*3/uL (ref 0.0–0.2)
Basos: 1 %
EOS (ABSOLUTE): 0.1 10*3/uL (ref 0.0–0.4)
Eos: 1 %
Hematocrit: 41.2 % (ref 34.0–46.6)
Hemoglobin: 14 g/dL (ref 11.1–15.9)
Immature Grans (Abs): 0 10*3/uL (ref 0.0–0.1)
Immature Granulocytes: 0 %
Lymphocytes Absolute: 1.8 10*3/uL (ref 0.7–3.1)
Lymphs: 34 %
MCH: 31.6 pg (ref 26.6–33.0)
MCHC: 34 g/dL (ref 31.5–35.7)
MCV: 93 fL (ref 79–97)
Monocytes Absolute: 0.3 10*3/uL (ref 0.1–0.9)
Monocytes: 6 %
Neutrophils Absolute: 3.1 10*3/uL (ref 1.4–7.0)
Neutrophils: 58 %
Platelets: 191 10*3/uL (ref 150–450)
RBC: 4.43 x10E6/uL (ref 3.77–5.28)
RDW: 12.8 % (ref 11.7–15.4)
WBC: 5.2 10*3/uL (ref 3.4–10.8)

## 2020-06-25 LAB — COMPREHENSIVE METABOLIC PANEL
ALT: 6 IU/L (ref 0–32)
AST: 14 IU/L (ref 0–40)
Albumin/Globulin Ratio: 1.5 (ref 1.2–2.2)
Albumin: 4.7 g/dL (ref 3.8–4.8)
Alkaline Phosphatase: 56 IU/L (ref 44–121)
BUN/Creatinine Ratio: 9 (ref 9–23)
BUN: 15 mg/dL (ref 6–20)
Bilirubin Total: 0.5 mg/dL (ref 0.0–1.2)
CO2: 18 mmol/L — ABNORMAL LOW (ref 20–29)
Calcium: 9.3 mg/dL (ref 8.7–10.2)
Chloride: 101 mmol/L (ref 96–106)
Creatinine, Ser: 1.58 mg/dL — ABNORMAL HIGH (ref 0.57–1.00)
GFR calc Af Amer: 48 mL/min/{1.73_m2} — ABNORMAL LOW (ref >59)
GFR calc non Af Amer: 41 mL/min/{1.73_m2} — ABNORMAL LOW (ref >59)
Globulin, Total: 3.2 g/dL (ref 1.5–4.5)
Glucose: 69 mg/dL (ref 65–99)
Potassium: 4.7 mmol/L (ref 3.5–5.2)
Sodium: 137 mmol/L (ref 134–144)
Total Protein: 7.9 g/dL (ref 6.0–8.5)

## 2020-06-25 LAB — MICROSCOPIC EXAMINATION
Bacteria, UA: NONE SEEN
Casts: NONE SEEN /lpf
RBC, Urine: NONE SEEN /hpf (ref 0–2)
WBC, UA: NONE SEEN /hpf (ref 0–5)

## 2020-06-25 LAB — URINALYSIS, COMPLETE
Bilirubin, UA: NEGATIVE
Glucose, UA: NEGATIVE
Ketones, UA: NEGATIVE
Leukocytes,UA: NEGATIVE
Nitrite, UA: NEGATIVE
Protein,UA: NEGATIVE
RBC, UA: NEGATIVE
Specific Gravity, UA: <=1.005 — AB (ref 1.005–1.030)
Urobilinogen, Ur: 0.2 mg/dL (ref 0.2–1.0)
pH, UA: 5.5 (ref 5.0–7.5)

## 2020-06-25 LAB — PROTEIN / CREATININE RATIO, URINE
Creatinine, Urine: 18.9 mg/dL
Protein, Ur: 7.7 mg/dL
Protein/Creat Ratio: 407 mg/g creat — ABNORMAL HIGH (ref 0–200)

## 2020-06-25 LAB — MAGNESIUM: Magnesium: 2.1 mg/dL (ref 1.6–2.3)

## 2020-06-25 LAB — PROLACTIN: Prolactin: 1.5 ng/mL — ABNORMAL LOW (ref 4.8–23.3)

## 2020-06-25 LAB — PHOSPHORUS: Phosphorus: 3.8 mg/dL (ref 3.0–4.3)

## 2020-07-08 ENCOUNTER — Telehealth: Payer: Self-pay | Admitting: Internal Medicine

## 2020-07-08 NOTE — Telephone Encounter (Signed)
Patient called asking for the prescription cabergoline (DOSTINEX) 0.5 MG tablet for 90 days. Patient stated that Doctor was supposed to send it on her last visit but she did not. [facility] [address]

## 2020-07-15 NOTE — Telephone Encounter (Signed)
Discussed with Dr. Amil Amen- okay to send refill Called it in at [facility] -  Patient informed- states her insurance only cover 90 day supply

## 2020-07-20 ENCOUNTER — Encounter: Payer: Self-pay | Admitting: Family Medicine

## 2020-07-21 ENCOUNTER — Telehealth: Payer: Self-pay | Admitting: Clinical

## 2020-07-21 DIAGNOSIS — R7989 Other specified abnormal findings of blood chemistry: Secondary | ICD-10-CM

## 2020-07-21 DIAGNOSIS — D352 Benign neoplasm of pituitary gland: Secondary | ICD-10-CM

## 2020-07-21 NOTE — Telephone Encounter (Signed)
Message sent to [place], NP and patient that I would go ahead with Endocrine referral. Please notify patient we are sending referral today to Dr. Sharl Ma, [place] [facility].

## 2020-07-23 NOTE — Telephone Encounter (Signed)
Spoke with patient and notified her of new referral and cancellation of previous referral.

## 2020-07-23 NOTE — Telephone Encounter (Signed)
Sending to Dr. Everardo All at [facility] --cancel previous referral

## 2020-07-23 NOTE — Telephone Encounter (Signed)
Spoke with patient and she stated that she cannot go to any physician from [facility] because her insurance will not cover it. Patient asked if you can please is there any other option for the referral she needs.

## 2020-07-27 NOTE — Telephone Encounter (Signed)
Absolutely fine with [place] at [facility]. I think its best to have endocrinology follow along.

## 2020-08-03 ENCOUNTER — Other Ambulatory Visit: Payer: Self-pay | Admitting: Family Medicine

## 2020-08-03 ENCOUNTER — Encounter: Payer: Self-pay | Admitting: Family Medicine

## 2020-08-03 MED ORDER — NURTEC 75 MG PO TBDP: 75.0000 mg | ORAL_TABLET | Freq: Every day | ORAL | 11 refills | Status: DC | PRN

## 2020-08-13 ENCOUNTER — Telehealth: Payer: Self-pay

## 2020-08-13 NOTE — Telephone Encounter (Signed)
I submitted PA request for [employer] on [facility], Key: [id number]. Received instant approval from insurance.    PIRJJO:[id number];KZSWFU:[id number];Review Type:Prior Auth;Coverage Start Date:07/14/2020;Coverage End Date:08/13/2021

## 2020-08-17 ENCOUNTER — Other Ambulatory Visit: Payer: Self-pay

## 2020-08-17 ENCOUNTER — Other Ambulatory Visit (INDEPENDENT_AMBULATORY_CARE_PROVIDER_SITE_OTHER): Payer: BC Managed Care – PPO | Admitting: Internal Medicine

## 2020-08-17 DIAGNOSIS — Z20822 Contact with and (suspected) exposure to covid-19: Secondary | ICD-10-CM

## 2020-08-17 LAB — POC COVID19 BINAXNOW: SARS Coronavirus 2 Ag: NEGATIVE

## 2020-08-17 NOTE — Progress Notes (Signed)
Here for COVID Testing. Not clear on when her sister and wife turned positive for COVID, but was exposed to them on Jan 19th and here for 5 day post exposure testing.   No symptoms  POCT Binax Now negative  Shared with patient.

## 2020-08-26 ENCOUNTER — Other Ambulatory Visit: Payer: Self-pay

## 2020-08-26 ENCOUNTER — Ambulatory Visit (INDEPENDENT_AMBULATORY_CARE_PROVIDER_SITE_OTHER): Payer: BC Managed Care – PPO | Admitting: Gastroenterology

## 2020-08-26 ENCOUNTER — Encounter: Payer: Self-pay | Admitting: Gastroenterology

## 2020-08-26 VITALS — BP 112/86 | Ht 62.0 in | Wt 149.0 lb

## 2020-08-26 DIAGNOSIS — Z1211 Encounter for screening for malignant neoplasm of colon: Secondary | ICD-10-CM

## 2020-08-26 DIAGNOSIS — Z800 Family history of malignant neoplasm of digestive organs: Secondary | ICD-10-CM | POA: Diagnosis not present

## 2020-08-26 NOTE — Patient Instructions (Signed)
If you are age 39 or younger, your body mass index should be between 19-25. Your Body mass index is 27.25 kg/m. If this is out of the aformentioned range listed, please consider follow up with your Primary Care Provider.   You have been scheduled for a colonoscopy. Please follow written instructions given to you at your visit today.  Please pick up your prep supplies at the pharmacy within the next 1-3 days. If you use inhalers (even only as needed), please bring them with you on the day of your procedure.  Due to recent changes in healthcare laws, you may see the results of your imaging and laboratory studies on MyChart before your provider has had a chance to review them.  We understand that in some cases there may be results that are confusing or concerning to you. Not all laboratory results come back in the same time frame and the provider may be waiting for multiple results in order to interpret others.  Please give [place] 48 hours in order for your provider to thoroughly review all the results before contacting the office for clarification of your results.   Thank you for entrusting me with your care and choosing [facility].  Dr Ardis Hughs

## 2020-08-26 NOTE — Progress Notes (Signed)
HPI: This is a very pleasant 40 year old woman who was referred to me by Mack Hook, MD  to evaluate family history colon cancer, family history of colon polyps.    Her maternal grandfather had colon cancer and her maternal aunt just had colon surgery today for colon cancer.  Her sister was recently diagnosed with precancerous colon polyps.  She herself has no real troubles with her bowels.  She tends to have a little bit of constipation but this responds well to over-the-counter medicines.  She has never seen blood in her stool.  She does not have any significant abdominal pains.  Her weight is overall stable   Old Data Reviewed: Lab testing December 2021 CBC was normal, complete metabolic profile was normal except for a creatinine 1.58.   Review of systems: Pertinent positive and negative review of systems were noted in the above HPI section. All other review negative.   Past Medical History:  Diagnosis Date  . Asthma    Since birth.  . Headache   . Hypertension age 19  . Infertility, female   . Prematurity 1983   delivered by C/S at 7.5 months gestation.  Ventilated for unknown period of time. Had left lung pneumothorax at 3 months with chest tube.  . Seasonal allergies child  . Uterine leiomyoma 2018   Removed 11/2016  . Uterine polyp 2018    Past Surgical History:  Procedure Laterality Date  . excision uterine leiomyoma  11/2016    Current Outpatient Medications  Medication Sig Dispense Refill  . acetaminophen (TYLENOL) 500 MG tablet Take 1,000 mg by mouth every 6 (six) hours as needed. For pain    . amLODipine (NORVASC) 5 MG tablet Take 5 mg by mouth daily.    . cabergoline (DOSTINEX) 0.5 MG tablet 0.25 mg 2 (two) times a week.     . Cholecalciferol (VITAMIN D3) 50 MCG (2000 UT) TABS Take by mouth. 1 daily    . cloNIDine (CATAPRES) 0.1 MG tablet TAKE 2 TABLETS BY MOUTH THREE TIMES DAILY 180 tablet 11  . Galcanezumab-gnlm (EMGALITY) 120 MG/ML SOSY Inject 120  mg into the skin every 30 (thirty) days. 1.12 mL 11  . polyethylene glycol powder (MIRALAX) 17 GM/SCOOP powder 17 g in 8 ounces water or juice once to twice daily 578 g 11  . Prenatal 27-1 MG TABS TAKE 1 TABLET BY MOUTH DAILY 90 tablet 3  . Rimegepant Sulfate (NURTEC) 75 MG TBDP Take 75 mg by mouth daily as needed (take for abortive therapy of migraine, no more than 1 tablet in 24 hours or 10 per month). 8 tablet 11   No current facility-administered medications for this visit.    Allergies as of 08/26/2020 - Review Complete 08/26/2020  Allergen Reaction Noted  . Other Other (See Comments) 10/29/2019  . Flagyl [metronidazole] Rash 08/14/2015    Family History  Problem Relation Age of Onset  . Asthma Mother   . Hypertension Mother   . Thyroid disease Mother   . Hypertension Sister   . Anxiety disorder Sister   . Colon polyps Sister   . Colon cancer Maternal Grandfather        50's?  . Colon cancer Maternal Aunt 54    Social History   Socioeconomic History  . Marital status: Single    Spouse name: Not on file  . Number of children: 1  . Years of education: Not on file  . Highest education level: Associate degree: occupational, Hotel manager, or vocational program  Occupational History  . Not on file  Tobacco Use  . Smoking status: Former Smoker    Packs/day: 0.15    Types: Cigarettes  . Smokeless tobacco: Never Used  . Tobacco comment: Would like to table for now.  Vaping Use  . Vaping Use: Never used  Substance and Sexual Activity  . Alcohol use: Yes    Comment: occasional wine with dinner  . Drug use: No  . Sexual activity: Never  Other Topics Concern  . Not on file  Social History Narrative  . Not on file   Social Determinants of Health   Financial Resource Strain: Not on file  Food Insecurity: Not on file  Transportation Needs: Not on file  Physical Activity: Not on file  Stress: Not on file  Social Connections: Not on file  Intimate Partner Violence: Not  on file     Physical Exam: Ht 5\' 2"  (1.575 m)   Wt 149 lb (67.6 kg)   BMI 27.25 kg/m  Constitutional: generally well-appearing Psychiatric: alert and oriented x3 Eyes: extraocular movements intact Mouth: oral pharynx moist, no lesions Neck: supple no lymphadenopathy Cardiovascular: heart regular rate and rhythm Lungs: clear to auscultation bilaterally Abdomen: soft, nontender, nondistended, no obvious ascites, no peritoneal signs, normal bowel sounds Extremities: no lower extremity edema bilaterally Skin: no lesions on visible extremities   Assessment and plan: 40 y.o. female with family history of colon cancer, family history of colon polyps.  She is probably at somewhat increased risk for colon cancer herself given her family tree.  I recommended colonoscopy for her at her soonest convenience.  I see no reason for any further blood tests or imaging studies prior to then.    Please see the "Patient Instructions" section for addition details about the plan.   Owens Loffler, MD [facility] 08/26/2020, 10:43 AM  Cc: Mack Hook, MD  Total time on date of encounter was 40  minutes (this included time spent preparing to see the patient reviewing records; obtaining and/or reviewing separately obtained history; performing a medically appropriate exam and/or evaluation; counseling and educating the patient and family if present; ordering medications, tests or procedures if applicable; and documenting clinical information in the health record).

## 2020-09-02 ENCOUNTER — Encounter: Payer: Self-pay | Admitting: Family Medicine

## 2020-09-07 DIAGNOSIS — Z0289 Encounter for other administrative examinations: Secondary | ICD-10-CM

## 2020-09-08 ENCOUNTER — Telehealth: Payer: Self-pay | Admitting: *Deleted

## 2020-09-08 NOTE — Telephone Encounter (Signed)
Received FMLA completed to AL/NP for review , completion and signature.

## 2020-09-08 NOTE — Telephone Encounter (Signed)
To MR after review. Completion and signature.

## 2020-09-10 ENCOUNTER — Telehealth (INDEPENDENT_AMBULATORY_CARE_PROVIDER_SITE_OTHER): Payer: BC Managed Care – PPO | Admitting: Endocrinology

## 2020-09-10 ENCOUNTER — Other Ambulatory Visit: Payer: Self-pay

## 2020-09-10 ENCOUNTER — Encounter: Payer: Self-pay | Admitting: Endocrinology

## 2020-09-10 ENCOUNTER — Telehealth: Payer: Self-pay | Admitting: *Deleted

## 2020-09-10 VITALS — Ht 62.0 in | Wt 148.0 lb

## 2020-09-10 DIAGNOSIS — D352 Benign neoplasm of pituitary gland: Secondary | ICD-10-CM

## 2020-09-10 DIAGNOSIS — I151 Hypertension secondary to other renal disorders: Secondary | ICD-10-CM

## 2020-09-10 DIAGNOSIS — N2889 Other specified disorders of kidney and ureter: Secondary | ICD-10-CM

## 2020-09-10 MED ORDER — CABERGOLINE 0.5 MG PO TABS: 0.5000 mg | ORAL_TABLET | ORAL | 3 refills | Status: DC

## 2020-09-10 NOTE — Telephone Encounter (Signed)
Pt [place] form faxed on 09/10/20 to [phone]

## 2020-09-10 NOTE — Patient Instructions (Addendum)
Please continue the same cabergoline.  Blood tests are requested for you today.  We'll let you know about the results.  If the pregnancy happens, please stop taking the cabergoline. Please come back for a follow-up appointment in 6 months.

## 2020-09-10 NOTE — Progress Notes (Signed)
Subjective:    Patient ID: Tonya Wilson, female    DOB: 05/25/1981, 40 y.o.   MRN: [id number]  HPI  telehealth visit today via telephone x 31 minutes.  Alternatives to telehealth are presented to this patient, and the patient agrees to the telehealth visit. Pt is advised of the cost of the visit, and agrees to this, also.   Patient is at home, and I am at the office.   Persons attending the telehealth visit: the patient and I Pt is referred by Dr Amil Amen, for hyperprolactinemia.  Pt had menarche at age 42.  She has always had irregular menses. She is G0. She wants to start a pregnancy now.  She took oral contraceptives 2018-2021.  She stopped, due to HTN and preparation for pregnancy. She was noted to have an elevated prolactin in 2018, when she presented with hypomenorrhea.  She denies the following: excessive exercise, opiates, antipsychotics, brain XRT, brain surgery, liver dz, thyroid dz, zoster, brain injury, or chest wall injury.  She had several seizures as as a child, but not since.  She has been dx'ed with hirsutism, but not PCO.  She started cabergoline in 2018. Since she stopped OC's, she has normal menses.  Pt says this also contributed to reg menses.  Pt requests BMET with labs.   Past Medical History:  Diagnosis Date  . Asthma    Since birth.  . Headache   . Hypertension age 67  . Infertility, female   . Prematurity 1983   delivered by C/S at 7.5 months gestation.  Ventilated for unknown period of time. Had left lung pneumothorax at 3 months with chest tube.  . Seasonal allergies child  . Uterine leiomyoma 2018   Removed 11/2016  . Uterine polyp 2018    Past Surgical History:  Procedure Laterality Date  . excision uterine leiomyoma  11/2016    Social History   Socioeconomic History  . Marital status: Single    Spouse name: Not on file  . Number of children: 1  . Years of education: Not on file  . Highest education level: Associate degree: occupational,  Hotel manager, or vocational program  Occupational History  . Not on file  Tobacco Use  . Smoking status: Former Smoker    Packs/day: 0.15    Types: Cigarettes  . Smokeless tobacco: Never Used  . Tobacco comment: Would like to table for now.  Vaping Use  . Vaping Use: Never used  Substance and Sexual Activity  . Alcohol use: Yes    Comment: occasional wine with dinner  . Drug use: No  . Sexual activity: Never  Other Topics Concern  . Not on file  Social History Narrative  . Not on file   Social Determinants of Health   Financial Resource Strain: Not on file  Food Insecurity: Not on file  Transportation Needs: Not on file  Physical Activity: Not on file  Stress: Not on file  Social Connections: Not on file  Intimate Partner Violence: Not on file    Current Outpatient Medications on File Prior to Visit  Medication Sig Dispense Refill  . acetaminophen (TYLENOL) 500 MG tablet Take 1,000 mg by mouth every 6 (six) hours as needed. For pain    . amLODipine (NORVASC) 5 MG tablet Take 5 mg by mouth daily.    . Cholecalciferol (VITAMIN D3) 50 MCG (2000 UT) TABS Take by mouth. 1 daily    . cloNIDine (CATAPRES) 0.1 MG tablet TAKE 2 TABLETS BY MOUTH THREE  TIMES DAILY 180 tablet 11  . polyethylene glycol powder (MIRALAX) 17 GM/SCOOP powder 17 g in 8 ounces water or juice once to twice daily 578 g 11  . Prenatal 27-1 MG TABS TAKE 1 TABLET BY MOUTH DAILY 90 tablet 3  . Rimegepant Sulfate (NURTEC) 75 MG TBDP Take 75 mg by mouth daily as needed (take for abortive therapy of migraine, no more than 1 tablet in 24 hours or 10 per month). 8 tablet 11  . Galcanezumab-gnlm (EMGALITY) 120 MG/ML SOSY Inject 120 mg into the skin every 30 (thirty) days. (Patient not taking: Reported on 09/10/2020) 1.12 mL 11   No current facility-administered medications on file prior to visit.    Allergies  Allergen Reactions  . Other Other (See Comments)    NO NSAIDS 2/2 CKD PER PT  . Flagyl [Metronidazole] Rash     Family History  Problem Relation Age of Onset  . Asthma Mother   . Hypertension Mother   . Thyroid disease Mother   . Hypertension Sister   . Anxiety disorder Sister   . Colon polyps Sister   . Colon cancer Maternal Grandfather        50's?  . Colon cancer Maternal Aunt 54    Ht 5\' 2"  (1.575 m)   Wt 148 lb (67.1 kg)   BMI 27.07 kg/m    Review of Systems Denies galactorrhea.  She has chronic migraine.      Objective:   Physical Exam    MRI (2018): poss microadenoma.  2021: Normal  Prolactin (2021)=1.5  Lab Results  Component Value Date   CREATININE 1.58 (H) 06/24/2020   BUN 15 06/24/2020   NA 137 06/24/2020   K 4.7 06/24/2020   CL 101 06/24/2020   CO2 18 (L) 06/24/2020   I have reviewed outside records, and summarized: Pt was noted to have elevated prolactin, and referred here.  She had myomectomy in 2021.       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  well-controlled.  Fertility: I ordered TSH.  Patient Instructions  Please continue the same cabergoline.  Blood tests are requested for you today.  We'll let you know about the results.  If the pregnancy happens, please stop taking the cabergoline. Please come back for a follow-up appointment in 6 months.

## 2020-09-16 ENCOUNTER — Other Ambulatory Visit (INDEPENDENT_AMBULATORY_CARE_PROVIDER_SITE_OTHER): Payer: BC Managed Care – PPO

## 2020-09-16 ENCOUNTER — Other Ambulatory Visit: Payer: Self-pay

## 2020-09-16 DIAGNOSIS — N2889 Other specified disorders of kidney and ureter: Secondary | ICD-10-CM

## 2020-09-16 DIAGNOSIS — D352 Benign neoplasm of pituitary gland: Secondary | ICD-10-CM | POA: Diagnosis not present

## 2020-09-16 DIAGNOSIS — I151 Hypertension secondary to other renal disorders: Secondary | ICD-10-CM | POA: Diagnosis not present

## 2020-09-16 LAB — BASIC METABOLIC PANEL
BUN: 17 mg/dL (ref 6–23)
CO2: 25 mEq/L (ref 19–32)
Calcium: 9.2 mg/dL (ref 8.4–10.5)
Chloride: 105 mEq/L (ref 96–112)
Creatinine, Ser: 1.63 mg/dL — ABNORMAL HIGH (ref 0.40–1.20)
GFR: 39.79 mL/min — ABNORMAL LOW (ref >60.00)
Glucose, Bld: 89 mg/dL (ref 70–99)
Potassium: 4.6 mEq/L (ref 3.5–5.1)
Sodium: 138 mEq/L (ref 135–145)

## 2020-09-16 LAB — TSH: TSH: 1.33 u[IU]/mL (ref 0.35–4.50)

## 2020-09-16 LAB — CORTISOL: Cortisol, Plasma: 9.4 ug/dL

## 2020-09-16 LAB — T4, FREE: Free T4: 0.95 ng/dL (ref 0.60–1.60)

## 2020-10-19 ENCOUNTER — Other Ambulatory Visit: Payer: Self-pay | Admitting: Gastroenterology

## 2020-10-19 DIAGNOSIS — Z1152 Encounter for screening for COVID-19: Secondary | ICD-10-CM | POA: Diagnosis not present

## 2020-10-21 ENCOUNTER — Ambulatory Visit (AMBULATORY_SURGERY_CENTER): Payer: BC Managed Care – PPO | Admitting: Gastroenterology

## 2020-10-21 ENCOUNTER — Encounter: Payer: Self-pay | Admitting: Gastroenterology

## 2020-10-21 VITALS — BP 125/85 | HR 57 | Temp 97.8°F | Resp 25 | Ht 62.0 in | Wt 149.0 lb

## 2020-10-21 DIAGNOSIS — D125 Benign neoplasm of sigmoid colon: Secondary | ICD-10-CM

## 2020-10-21 DIAGNOSIS — Z800 Family history of malignant neoplasm of digestive organs: Secondary | ICD-10-CM | POA: Diagnosis not present

## 2020-10-21 DIAGNOSIS — Z1211 Encounter for screening for malignant neoplasm of colon: Secondary | ICD-10-CM | POA: Diagnosis not present

## 2020-10-21 DIAGNOSIS — K635 Polyp of colon: Secondary | ICD-10-CM

## 2020-10-21 DIAGNOSIS — Z8371 Family history of colonic polyps: Secondary | ICD-10-CM | POA: Diagnosis not present

## 2020-10-21 MED ORDER — SODIUM CHLORIDE 0.9 % IV SOLN: 500.0000 mL | Freq: Once | INTRAVENOUS | Status: DC

## 2020-10-21 NOTE — Progress Notes (Signed)
AG vitals and [place] IV.

## 2020-10-21 NOTE — Patient Instructions (Signed)
Await pathology  Please read over handout about polyps  Continue your normal medications   YOU HAD AN ENDOSCOPIC PROCEDURE TODAY AT THE [facility]:   Refer to the procedure report that was given to you for any specific questions about what was found during the examination.  If the procedure report does not answer your questions, please call your gastroenterologist to clarify.  If you requested that your care partner not be given the details of your procedure findings, then the procedure report has been included in a sealed envelope for you to review at your convenience later.  YOU SHOULD EXPECT: Some feelings of bloating in the abdomen. Passage of more gas than usual.  Walking can help get rid of the air that was put into your GI tract during the procedure and reduce the bloating. If you had a lower endoscopy (such as a colonoscopy or flexible sigmoidoscopy) you may notice spotting of blood in your stool or on the toilet paper. If you underwent a bowel prep for your procedure, you may not have a normal bowel movement for a few days.  Please Note:  You might notice some irritation and congestion in your nose or some drainage.  This is from the oxygen used during your procedure.  There is no need for concern and it should clear up in a day or so.  SYMPTOMS TO REPORT IMMEDIATELY:  Following lower endoscopy (colonoscopy or flexible sigmoidoscopy):  Excessive amounts of blood in the stool  Significant tenderness or worsening of abdominal pains  Swelling of the abdomen that is new, acute  Fever of 100F or higher  For urgent or emergent issues, a gastroenterologist can be reached at any hour by calling [phone]. Do not use MyChart messaging for urgent concerns.    DIET:  We do recommend a small meal at first, but then you may proceed to your regular diet.  Drink plenty of fluids but you should avoid alcoholic beverages for 24 hours.  ACTIVITY:  You should plan to take it easy  for the rest of today and you should NOT DRIVE or use heavy machinery until tomorrow (because of the sedation medicines used during the test).    FOLLOW UP: Our staff will call the number listed on your records 48-72 hours following your procedure to check on you and address any questions or concerns that you may have regarding the information given to you following your procedure. If we do not reach you, we will leave a message.  We will attempt to reach you two times.  During this call, we will ask if you have developed any symptoms of COVID 19. If you develop any symptoms (ie: fever, flu-like symptoms, shortness of breath, cough etc.) before then, please call [phone].  If you test positive for Covid 19 in the 2 weeks post procedure, please call and report this information to us.    If any biopsies were taken you will be contacted by phone or by letter within the next 1-3 weeks.  Please call us at [phone] if you have not heard about the biopsies in 3 weeks.    SIGNATURES/CONFIDENTIALITY: You and/or your care partner have signed paperwork which will be entered into your electronic medical record.  These signatures attest to the fact that that the information above on your After Visit Summary has been reviewed and is understood.  Full responsibility of the confidentiality of this discharge information lies with you and/or your care-partner.  

## 2020-10-21 NOTE — Progress Notes (Signed)
Called to room to assist during endoscopic procedure.  Patient ID and intended procedure confirmed with present staff. Received instructions for my participation in the procedure from the performing physician.  

## 2020-10-21 NOTE — Op Note (Signed)
[facility] Patient Name: Tonya Wilson Procedure Date: 10/21/2020 2:31 PM MRN: [id number] Endoscopist: Milus Banister , MD Age: 40 Referring MD:  Date of Birth: 1980-08-16 Gender: Female Account #: [id number] Procedure:                Colonoscopy Indications:              Colon cancer screening in patient at increased                            risk: Family history of colorectal cancer in                            multiple 2nd degree relatives; Her maternal                            grandfather had colon cancer and her maternal aunt                            just had colon surgery today for colon cancer. Her                            sister was recently diagnosed with precancerous                            colon polyps Medicines:                Monitored Anesthesia Care Procedure:                Pre-Anesthesia Assessment:                           - Prior to the procedure, a History and Physical                            was performed, and patient medications and                            allergies were reviewed. The patient's tolerance of                            previous anesthesia was also reviewed. The risks                            and benefits of the procedure and the sedation                            options and risks were discussed with the patient.                            All questions were answered, and informed consent                            was obtained. Prior Anticoagulants: The patient has  taken no previous anticoagulant or antiplatelet                            agents. ASA Grade Assessment: II - A patient with                            mild systemic disease. After reviewing the risks                            and benefits, the patient was deemed in                            satisfactory condition to undergo the procedure.                           After obtaining informed consent, the colonoscope                             was passed under direct vision. Throughout the                            procedure, the patient's blood pressure, pulse, and                            oxygen saturations were monitored continuously. The                            Olympus CF-HQ190L ([id number]) Colonoscope was                            introduced through the anus and advanced to the the                            cecum, identified by appendiceal orifice and                            ileocecal valve. The colonoscopy was performed                            without difficulty. The patient tolerated the                            procedure well. The quality of the bowel                            preparation was good. The ileocecal valve,                            appendiceal orifice, and rectum were photographed. Scope In: 2:36:43 PM Scope Out: 2:52:04 PM Scope Withdrawal Time: 0 hours 11 minutes 3 seconds  Total Procedure Duration: 0 hours 15 minutes 21 seconds  Findings:                 A 3 mm polyp was found in the sigmoid colon. The  polyp was sessile. The polyp was removed with a                            cold snare. Resection and retrieval were complete.                           The exam was otherwise without abnormality on                            direct and retroflexion views. Complications:            No immediate complications. Estimated blood loss:                            None. Estimated Blood Loss:     Estimated blood loss: none. Impression:               - One 3 mm polyp in the sigmoid colon, removed with                            a cold snare. Resected and retrieved.                           - The examination was otherwise normal on direct                            and retroflexion views. Recommendation:           - Patient has a contact number available for                            emergencies. The signs and symptoms of potential                             delayed complications were discussed with the                            patient. Return to normal activities tomorrow.                            Written discharge instructions were provided to the                            patient.                           - Resume previous diet.                           - Continue present medications.                           - Await pathology results. Milus Banister, MD 10/21/2020 2:54:46 PM This report has been signed electronically.

## 2020-10-21 NOTE — Progress Notes (Signed)
To PACU, VSS. Report to [email or web address] 

## 2020-10-23 ENCOUNTER — Telehealth: Payer: Self-pay

## 2020-10-23 ENCOUNTER — Telehealth: Payer: Self-pay | Admitting: *Deleted

## 2020-10-23 NOTE — Telephone Encounter (Signed)
No voicemail on follow up call.

## 2020-10-23 NOTE — Telephone Encounter (Signed)
  Follow up Call-  Call back number 10/21/2020  Post procedure Call Back phone  # [phone]  Permission to leave phone message Yes  Some recent data might be hidden    Voicemail box not set up

## 2020-10-28 ENCOUNTER — Encounter: Payer: Self-pay | Admitting: Family Medicine

## 2020-10-28 LAB — SARS CORONAVIRUS 2 (TAT 6-24 HRS): SARS Coronavirus 2: NEGATIVE

## 2020-10-30 ENCOUNTER — Encounter: Payer: Self-pay | Admitting: Gastroenterology

## 2020-11-02 ENCOUNTER — Telehealth: Payer: Self-pay | Admitting: Family Medicine

## 2020-11-02 NOTE — Telephone Encounter (Signed)
Will place on my cancellation list for amy NP.

## 2020-11-02 NOTE — Telephone Encounter (Signed)
Pt called, need sooner appt than 11/25/20. Would like a call from the nurse  Check previous MyChart note.

## 2020-11-03 NOTE — Telephone Encounter (Signed)
LMVM for pt that got message. Will placed on list for an AL/NP cancellations.  She is to call me back if needed.

## 2020-11-09 DIAGNOSIS — Z20822 Contact with and (suspected) exposure to covid-19: Secondary | ICD-10-CM | POA: Diagnosis not present

## 2020-11-24 NOTE — Progress Notes (Addendum)
Chief Complaint  Patient presents with  . Follow-up    RM 2 alone Pt is still having migraines often, doesn't feel a change. Has about 7 a month      HISTORY OF PRESENT ILLNESS: 11/25/20 ALL: She returns for follow up for migraines. We switched her to [place] (from [place]) due to continued headaches. Bernita Raisin was not effective. Nurtec worked better for abortive therapy but makes her sick on her stomach. She can not take triptans due to kidney disease. She reports having about 7 migraine days per month. Nurtec does ease pain.   She is followed by Dr Everardo All with [facility]. 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 She reported desire for pregnancy at follow up 09/10/20. She reports having IVF treatments over a year ago. She is not on birth control. She was hoping to return in June for fertility treatments.    05/28/2020 ALL:  Tonya Wilson is a 40 y.o. female here today for follow up for headaches and pituitary adenoma on cabergoline. Last MRI w/o contrast 02/2020 did not show evidence of tumor. She was started on Ajovy for migraine prevention. She has taken 3 injections and does not feel that it has helped at all. She continues to have about 6-10 headache days per month. Most are throbbing in nature. Some sharp and stabbing headaches. She can not take triptans or Nsaids due to kidney disease. She is using Tylenol but does not feel it helps. She admits that stress levels are most likely contributor outside of menstrual migraines. 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 She is sleeping well. She had a normal eye exam. She is thinking about getting blue light glasses as she spends a lot of time on her computer.    HISTORY (copied from Dr Dohmeier's note on 02/26/2020)  Tonya Stewartis a 40  year old African American female patientseen here upon referralon 02/26/2020 from Dr. Delrae Alfred for migraine/ headaches in increasing frequency.   Chiefconcernaccording to patient :  " I have the pleasure of meeting Tonya Wilson today on 26 February 2020 she states that she has had irregular and sometimes very severe menstrual cycles occasionally with a lot of blood loss since at least 2012 she had moved to the [place] area from [place].  Here she underwent fertility treatments and recent work-up she was found to have high prolactin levels.  Following the high prolactin levels an MRI of the brain was ordered through the [facility] system, which confirmed the presence of an adenoma.  The patient has increasing frequency of migraines and nonmigrainous headaches and would like to be evaluated.   The MRI that confirmed a pituitary tumor was performed in 2018.  She is treated for hypertension with amlodipine and clonidine, which also helps her to sleep.  She is a former smoker, she does not drink alcohol, and she does not use caffeine.  I have the pleasure of seeing Tonya Wilson today,a right-handed [place] American female with a possible sleep disorder. She has a  has a past medical history of Asthma, Headache, Hypertension (age 89), Infertility, female, Prematurity (1983), Seasonal allergies (child), Uterine leiomyoma (2018), and Uterine polyp (2018).. She sleeps just fine on CLONIDINE she stated.   Headaches have evolved - she began taking birth control pills to regulate her cycle in 2017-2018 , and she noted that headaches were alway worse 2-3 days prior to ovulation and again worsening 2-3 days over the time of  menses.  Dr Rolin Barry is her physician. She had high blood pressure and had to come off Birth control. She had surgery in April for fibroidectomy,  But her headache did not change.  She feels as if she always has a tension or pressure , but sometimes sharp stabbing sensations for seconds on the left temple.  These will make her flush and tear up. Sometimes she feels as if cold ice water is poured over there head.  She  had severe migraines since age 38 or 55.  She reports photophobia - can't drive at night. Eye sight was tested and is excellent. Never waking her up at night  sometimes wakes up with headaches.    REVIEW OF SYSTEMS: Out of a complete 14 system review of symptoms, the patient complains only of the following symptoms, headaches, anxiety and all other reviewed systems are negative.   ALLERGIES: Allergies  Allergen Reactions  . Other Other (See Comments)    NO NSAIDS 2/2 CKD PER PT  . Flagyl [Metronidazole] Rash     HOME MEDICATIONS: Outpatient Medications Prior to Visit  Medication Sig Dispense Refill  . acetaminophen (TYLENOL) 500 MG tablet Take 1,000 mg by mouth every 6 (six) hours as needed. For pain    . amLODipine (NORVASC) 5 MG tablet Take 5 mg by mouth daily.    . cabergoline (DOSTINEX) 0.5 MG tablet Take 1 tablet (0.5 mg total) by mouth 2 (two) times a week. 26 tablet 3  . Cholecalciferol (VITAMIN D3) 50 MCG (2000 UT) TABS Take by mouth. 1 daily    . cloNIDine (CATAPRES) 0.1 MG tablet TAKE 2 TABLETS BY MOUTH THREE TIMES DAILY 180 tablet 11  . polyethylene glycol powder (MIRALAX) 17 GM/SCOOP powder 17 g in 8 ounces water or juice once to twice daily 578 g 11  . Prenatal 27-1 MG TABS TAKE 1 TABLET BY MOUTH DAILY 90 tablet 3  . Galcanezumab-gnlm (EMGALITY) 120 MG/ML SOSY Inject 120 mg into the skin every 30 (thirty) days. 1.12 mL 11  . Rimegepant Sulfate (NURTEC) 75 MG TBDP Take 75 mg by mouth daily as needed (take for abortive therapy of migraine, no more than 1 tablet in 24 hours or 10 per month). 8 tablet 11   No facility-administered medications prior to visit.     PAST MEDICAL HISTORY: Past Medical History:  Diagnosis Date  . Anemia   . Asthma    Since birth.  . Headache   . Heart murmur   . Hypertension age 13  . Infertility, female   . Prematurity 1983   delivered by C/S at 7.5 months gestation.  Ventilated for unknown period of time. Had left lung pneumothorax at 3  months with chest tube.  . Seasonal allergies child  . Uterine leiomyoma 2018   Removed 11/2016  . Uterine polyp 2018     PAST SURGICAL HISTORY: Past Surgical History:  Procedure Laterality Date  . excision uterine leiomyoma  11/2016  . HYSTEROSCOPY  2018   removal of fibroids     FAMILY HISTORY: Family History  Problem Relation Age of Onset  . Asthma Mother   . Hypertension Mother   . Thyroid disease Mother   . Hypertension Sister   . Anxiety disorder Sister   . Colon polyps Sister   . Colon cancer Maternal Grandfather        50's?  . Colon cancer Maternal Aunt 67  . Rectal cancer Neg Hx   . Stomach cancer Neg Hx   .  Esophageal cancer Neg Hx      SOCIAL HISTORY: Social History   Socioeconomic History  . Marital status: Single    Spouse name: Not on file  . Number of children: 1  . Years of education: Not on file  . Highest education level: Associate degree: occupational, Hotel manager, or vocational program  Occupational History  . Not on file  Tobacco Use  . Smoking status: Former Smoker    Packs/day: 0.15    Types: Cigarettes  . Smokeless tobacco: Never Used  . Tobacco comment: Would like to table for now.  Vaping Use  . Vaping Use: Never used  Substance and Sexual Activity  . Alcohol use: Yes    Comment: occasional wine with dinner  . Drug use: No  . Sexual activity: Never  Other Topics Concern  . Not on file  Social History Narrative  . Not on file   Social Determinants of Health   Financial Resource Strain: Not on file  Food Insecurity: Not on file  Transportation Needs: Not on file  Physical Activity: Not on file  Stress: Not on file  Social Connections: Not on file  Intimate Partner Violence: Not on file      PHYSICAL EXAM  Vitals:   11/25/20 1025  BP: 123/88  Pulse: 73  Weight: 161 lb (73 kg)  Height: 5\' 2"  (1.575 m)   Body mass index is 29.45 kg/m.   Generalized: Well developed, in no acute distress   Cardiology: Normal  rate and rhythm, no murmur auscultated Respiratory: Clear to auscultation bilaterally  Neurological examination  Mentation: Alert oriented to time, place, history taking. Follows all commands speech and language fluent Cranial nerve II-XII: Pupils were equal round reactive to light. Extraocular movements were full, visual field were full . Motor: The motor testing reveals 5 over 5 strength of all 4 extremities. Good symmetric motor tone is noted throughout.  Gait and station: Gait is normal.     DIAGNOSTIC DATA (LABS, IMAGING, TESTING) - I reviewed patient records, labs, notes, testing and imaging myself where available.  Lab Results  Component Value Date   WBC 5.2 06/24/2020   HGB 14.0 06/24/2020   HCT 41.2 06/24/2020   MCV 93 06/24/2020   PLT 191 06/24/2020      Component Value Date/Time   NA 138 09/16/2020 0943   NA 137 06/24/2020 1346   K 4.6 09/16/2020 0943   CL 105 09/16/2020 0943   CO2 25 09/16/2020 0943   GLUCOSE 89 09/16/2020 0943   BUN 17 09/16/2020 0943   BUN 15 06/24/2020 1346   CREATININE 1.63 (H) 09/16/2020 0943   CALCIUM 9.2 09/16/2020 0943   PROT 7.9 06/24/2020 1346   ALBUMIN 4.7 06/24/2020 1346   AST 14 06/24/2020 1346   ALT 6 06/24/2020 1346   ALKPHOS 56 06/24/2020 1346   BILITOT 0.5 06/24/2020 1346   GFRNONAA 41 (L) 06/24/2020 1346   GFRAA 48 (L) 06/24/2020 1346   No results found for: CHOL, HDL, LDLCALC, LDLDIRECT, TRIG, CHOLHDL No results found for: HGBA1C No results found for: VITAMINB12 Lab Results  Component Value Date   TSH 1.33 09/16/2020      ASSESSMENT AND PLAN  40 y.o. year old female  has a past medical history of Anemia, Asthma, Headache, Heart murmur, Hypertension (age 25), Infertility, female, Prematurity (1983), Seasonal allergies (child), Uterine leiomyoma (2018), and Uterine polyp (2018). here with   Migraine with aura and with status migrainosus, not intractable  Pituitary adenoma ([place])  Ms. Camuso feels Emglaity has  helped reduce migraine days from 10/mth to 7/mth, however, she wishes to conceive. We have discussed safety of CGRP in pregnancy. Unfortunately the recommendation is to discontinue CGRP 6 months prior to conception. Data is limited on potential adverse effects. She agrees that it is best to discontinue Emgality at this time. She will continue Nurtec as needed and will notify me if she becomes pregnant. Will discuss other abortive options at that time. She is limited due to kidney disease. She may take ondansetron 4mg  as needed with Nurtec for nausea. Healthy lifestyle habits encouraged. She will follow-up with [place] in 1 year, sooner if needed. She verbalizes understanding and agreement with this plan.   Tonya Presto, MSN, FNP-C 11/25/2020, 12:05 PM  [facility] [address] [place], [place] [address] [phone]

## 2020-11-24 NOTE — Patient Instructions (Signed)
Below is our plan:  We will discontinue Emgality for now. Monitor headaches closely. Continue Nurtec and ondansetron as needed for abortive therapy.   Please make sure you are staying well hydrated. I recommend 50-60 ounces daily. Well balanced diet and regular exercise encouraged. Consistent sleep schedule with 6-8 hours recommended.   Please continue follow up with care team as directed.   Follow up with me in 1 year, sooner if needed   You may receive a survey regarding today's visit. I encourage you to leave honest feed back as I do use this information to improve patient care. Thank you for seeing me today!   Rimegepant oral dissolving tablet What is this medicine? RIMEGEPANT (ri ME je pant) is used to treat migraine headaches with or without aura. An aura is a strange feeling or visual disturbance that warns you of an attack. It is also used to prevent migraine headaches. This medicine may be used for other purposes; ask your health care provider or pharmacist if you have questions. COMMON BRAND NAME(S): NURTEC ODT What should I tell my health care provider before I take this medicine? They need to know if you have any of these conditions:  kidney disease  liver disease  an unusual or allergic reaction to rimegepant, other medicines, foods, dyes, or preservatives  pregnant or trying to get pregnant  breast-feeding How should I use this medicine? Take the medicine by mouth. Follow the directions on the prescription label. Leave the tablet in the sealed blister pack until you are ready to take it. With dry hands, open the blister and gently remove the tablet. If the tablet breaks or crumbles, throw it away and take a new tablet out of the blister pack. Place the tablet in the mouth and allow it to dissolve, and then swallow. Do not cut, crush, or chew this medicine. You do not need water to take this medicine. Talk to your pediatrician about the use of this medicine in children.  Special care may be needed. Overdosage: If you think you have taken too much of this medicine contact a poison control center or emergency room at once. NOTE: This medicine is only for you. Do not share this medicine with others. What if I miss a dose? This does not apply. This medicine is not for regular use. What may interact with this medicine? This medicine may interact with the following medications:  certain medicines for fungal infections like fluconazole, itraconazole  rifampin This list may not describe all possible interactions. Give your health care provider a list of all the medicines, herbs, non-prescription drugs, or dietary supplements you use. Also tell them if you smoke, drink alcohol, or use illegal drugs. Some items may interact with your medicine. What should I watch for while using this medicine? Visit your health care professional for regular checks on your progress. Tell your health care professional if your symptoms do not start to get better or if they get worse. What side effects may I notice from receiving this medicine? Side effects that you should report to your doctor or health care professional as soon as possible:  allergic reactions like skin rash, itching or hives; swelling of the face, lips, or tongue Side effects that usually do not require medical attention (report these to your doctor or health care professional if they continue or are bothersome):  nausea This list may not describe all possible side effects. Call your doctor for medical advice about side effects. You may report side  effects to FDA at 1-800-FDA-1088. Where should I keep my medicine? Keep out of the reach of children and pets. Store at room temperature between 20 and 25 degrees C (68 and 77 degrees F). Get rid of any unused medicine after the expiration date. To get rid of medicines that are no longer needed or have expired:  Take the medicine to a medicine take-back program. Check with  your pharmacy or law enforcement to find a location.  If you cannot return the medicine, check the label or package insert to see if the medicine should be thrown out in the garbage or flushed down the toilet. If you are not sure, ask your health care provider. If it is safe to put it in the trash, take the medicine out of the container. Mix the medicine with cat litter, dirt, coffee grounds, or other unwanted substance. Seal the mixture in a bag or container. Put it in the trash. NOTE: This sheet is a summary. It may not cover all possible information. If you have questions about this medicine, talk to your doctor, pharmacist, or health care provider.  2021 Elsevier/Gold Standard (2019-12-24 17:56:55)    Ondansetron tablets What is this medicine? ONDANSETRON (on DAN se tron) is used to treat nausea and vomiting caused by chemotherapy. It is also used to prevent or treat nausea and vomiting after surgery. This medicine may be used for other purposes; ask your health care provider or pharmacist if you have questions. COMMON BRAND NAME(S): Zofran What should I tell my health care provider before I take this medicine? They need to know if you have any of these conditions:  heart disease  history of irregular heartbeat  liver disease  low levels of magnesium or potassium in the blood  an unusual or allergic reaction to ondansetron, granisetron, other medicines, foods, dyes, or preservatives  pregnant or trying to get pregnant  breast-feeding How should I use this medicine? Take this medicine by mouth with a glass of water. Follow the directions on your prescription label. Take your doses at regular intervals. Do not take your medicine more often than directed. Talk to your pediatrician regarding the use of this medicine in children. Special care may be needed. Overdosage: If you think you have taken too much of this medicine contact a poison control center or emergency room at  once. NOTE: This medicine is only for you. Do not share this medicine with others. What if I miss a dose? If you miss a dose, take it as soon as you can. If it is almost time for your next dose, take only that dose. Do not take double or extra doses. What may interact with this medicine? Do not take this medicine with any of the following medications:  apomorphine  certain medicines for fungal infections like fluconazole, itraconazole, ketoconazole, posaconazole, voriconazole  cisapride  dronedarone  pimozide  thioridazine This medicine may also interact with the following medications:  carbamazepine  certain medicines for depression, anxiety, or psychotic disturbances  fentanyl  linezolid  MAOIs like Carbex, Eldepryl, Marplan, Nardil, and Parnate  methylene blue (injected into a vein)  other medicines that prolong the QT interval (cause an abnormal heart rhythm) like dofetilide, ziprasidone  phenytoin  rifampicin  tramadol This list may not describe all possible interactions. Give your health care provider a list of all the medicines, herbs, non-prescription drugs, or dietary supplements you use. Also tell them if you smoke, drink alcohol, or use illegal drugs. Some items may interact  with your medicine. What should I watch for while using this medicine? Check with your doctor or health care professional right away if you have any sign of an allergic reaction. What side effects may I notice from receiving this medicine? Side effects that you should report to your doctor or health care professional as soon as possible:  allergic reactions like skin rash, itching or hives, swelling of the face, lips or tongue  breathing problems  confusion  dizziness  fast or irregular heartbeat  feeling faint or lightheaded, falls  fever and chills  loss of balance or coordination  seizures  sweating  swelling of the hands or feet  tightness in the  chest  tremors  unusually weak or tired Side effects that usually do not require medical attention (report to your doctor or health care professional if they continue or are bothersome):  constipation or diarrhea  headache This list may not describe all possible side effects. Call your doctor for medical advice about side effects. You may report side effects to FDA at 1-800-FDA-1088. Where should I keep my medicine? Keep out of the reach of children. Store between 2 and 30 degrees C (36 and 86 degrees F). Throw away any unused medicine after the expiration date. NOTE: This sheet is a summary. It may not cover all possible information. If you have questions about this medicine, talk to your doctor, pharmacist, or health care provider.  2021 Elsevier/Gold Standard (2018-07-03 07:16:43)   Migraine Headache A migraine headache is a very strong throbbing pain on one side or both sides of your head. This type of headache can also cause other symptoms. It can last from 4 hours to 3 days. Talk with your doctor about what things may bring on (trigger) this condition. What are the causes? The exact cause of this condition is not known. This condition may be triggered or caused by:  Drinking alcohol.  Smoking.  Taking medicines, such as: ? Medicine used to treat chest pain (nitroglycerin). ? Birth control pills. ? Estrogen. ? Some blood pressure medicines.  Eating or drinking certain products.  Doing physical activity. Other things that may trigger a migraine headache include:  Having a menstrual period.  Pregnancy.  Hunger.  Stress.  Not getting enough sleep or getting too much sleep.  Weather changes.  Tiredness (fatigue). What increases the risk?  Being 73-55 years old.  Being female.  Having a family history of migraine headaches.  Being Caucasian.  Having depression or anxiety.  Being very overweight. What are the signs or symptoms?  A throbbing pain. This  pain may: ? Happen in any area of the head, such as on one side or both sides. ? Make it hard to do daily activities. ? Get worse with physical activity. ? Get worse around bright lights or loud noises.  Other symptoms may include: ? Feeling sick to your stomach (nauseous). ? Vomiting. ? Dizziness. ? Being sensitive to bright lights, loud noises, or smells.  Before you get a migraine headache, you may get warning signs (an aura). An aura may include: ? Seeing flashing lights or having blind spots. ? Seeing bright spots, halos, or zigzag lines. ? Having tunnel vision or blurred vision. ? Having numbness or a tingling feeling. ? Having trouble talking. ? Having weak muscles.  Some people have symptoms after a migraine headache (postdromal phase), such as: ? Tiredness. ? Trouble thinking (concentrating). How is this treated?  Taking medicines that: ? Relieve pain. ? Relieve the  feeling of being sick to your stomach. ? Prevent migraine headaches.  Treatment may also include: ? Having acupuncture. ? Avoiding foods that bring on migraine headaches. ? Learning ways to control your body functions (biofeedback). ? Therapy to help you know and deal with negative thoughts (cognitive behavioral therapy). Follow these instructions at home: Medicines  Take over-the-counter and prescription medicines only as told by your doctor.  Ask your doctor if the medicine prescribed to you: ? Requires you to avoid driving or using heavy machinery. ? Can cause trouble pooping (constipation). You may need to take these steps to prevent or treat trouble pooping:  Drink enough fluid to keep your pee (urine) pale yellow.  Take over-the-counter or prescription medicines.  Eat foods that are high in fiber. These include beans, whole grains, and fresh fruits and vegetables.  Limit foods that are high in fat and sugar. These include fried or sweet foods. Lifestyle  Do not drink alcohol.  Do not  use any products that contain nicotine or tobacco, such as cigarettes, e-cigarettes, and chewing tobacco. If you need help quitting, ask your doctor.  Get at least 8 hours of sleep every night.  Limit and deal with stress. General instructions  Keep a journal to find out what may bring on your migraine headaches. For example, write down: ? What you eat and drink. ? How much sleep you get. ? Any change in what you eat or drink. ? Any change in your medicines.  If you have a migraine headache: ? Avoid things that make your symptoms worse, such as bright lights. ? It may help to lie down in a dark, quiet room. ? Do not drive or use heavy machinery. ? Ask your doctor what activities are safe for you.  Keep all follow-up visits as told by your doctor. This is important.      Contact a doctor if:  You get a migraine headache that is different or worse than others you have had.  You have more than 15 headache days in one month. Get help right away if:  Your migraine headache gets very bad.  Your migraine headache lasts longer than 72 hours.  You have a fever.  You have a stiff neck.  You have trouble seeing.  Your muscles feel weak or like you cannot control them.  You start to lose your balance a lot.  You start to have trouble walking.  You pass out (faint).  You have a seizure. Summary  A migraine headache is a very strong throbbing pain on one side or both sides of your head. These headaches can also cause other symptoms.  This condition may be treated with medicines and changes to your lifestyle.  Keep a journal to find out what may bring on your migraine headaches.  Contact a doctor if you get a migraine headache that is different or worse than others you have had.  Contact your doctor if you have more than 15 headache days in a month. This information is not intended to replace advice given to you by your health care provider. Make sure you discuss any  questions you have with your health care provider. Document Revised: 11/02/2018 Document Reviewed: 08/23/2018 Elsevier Patient Education  [employer].

## 2020-11-25 ENCOUNTER — Ambulatory Visit (INDEPENDENT_AMBULATORY_CARE_PROVIDER_SITE_OTHER): Payer: BC Managed Care – PPO | Admitting: Family Medicine

## 2020-11-25 ENCOUNTER — Encounter: Payer: Self-pay | Admitting: Family Medicine

## 2020-11-25 ENCOUNTER — Other Ambulatory Visit: Payer: Self-pay

## 2020-11-25 VITALS — BP 123/88 | HR 73 | Ht 62.0 in | Wt 161.0 lb

## 2020-11-25 DIAGNOSIS — D352 Benign neoplasm of pituitary gland: Secondary | ICD-10-CM

## 2020-11-25 DIAGNOSIS — G43101 Migraine with aura, not intractable, with status migrainosus: Secondary | ICD-10-CM | POA: Diagnosis not present

## 2020-11-25 MED ORDER — ONDANSETRON HCL 4 MG PO TABS: 4.0000 mg | ORAL_TABLET | Freq: Three times a day (TID) | ORAL | 5 refills | Status: DC | PRN

## 2020-11-25 MED ORDER — NURTEC 75 MG PO TBDP: 75.0000 mg | ORAL_TABLET | Freq: Every day | ORAL | 11 refills | Status: DC | PRN

## 2020-12-23 ENCOUNTER — Ambulatory Visit (INDEPENDENT_AMBULATORY_CARE_PROVIDER_SITE_OTHER): Payer: BC Managed Care – PPO | Admitting: Internal Medicine

## 2020-12-23 ENCOUNTER — Encounter: Payer: Self-pay | Admitting: Internal Medicine

## 2020-12-23 ENCOUNTER — Other Ambulatory Visit: Payer: Self-pay

## 2020-12-23 VITALS — BP 130/110 | HR 80 | Resp 18 | Ht 62.75 in | Wt 156.5 lb

## 2020-12-23 DIAGNOSIS — Z0000 Encounter for general adult medical examination without abnormal findings: Secondary | ICD-10-CM | POA: Diagnosis not present

## 2020-12-23 DIAGNOSIS — N979 Female infertility, unspecified: Secondary | ICD-10-CM | POA: Diagnosis not present

## 2020-12-23 DIAGNOSIS — N189 Chronic kidney disease, unspecified: Secondary | ICD-10-CM | POA: Insufficient documentation

## 2020-12-23 DIAGNOSIS — Z13220 Encounter for screening for lipoid disorders: Secondary | ICD-10-CM

## 2020-12-23 DIAGNOSIS — N183 Chronic kidney disease, stage 3 unspecified: Secondary | ICD-10-CM | POA: Diagnosis not present

## 2020-12-23 DIAGNOSIS — F419 Anxiety disorder, unspecified: Secondary | ICD-10-CM

## 2020-12-23 DIAGNOSIS — E559 Vitamin D deficiency, unspecified: Secondary | ICD-10-CM | POA: Diagnosis not present

## 2020-12-23 DIAGNOSIS — D649 Anemia, unspecified: Secondary | ICD-10-CM | POA: Insufficient documentation

## 2020-12-23 DIAGNOSIS — F32A Depression, unspecified: Secondary | ICD-10-CM

## 2020-12-23 MED ORDER — FOLIC ACID 1 MG PO TABS: 1.0000 mg | ORAL_TABLET | Freq: Every day | ORAL | 3 refills | Status: DC

## 2020-12-23 MED ORDER — CLOBETASOL PROPIONATE 0.05 % EX CREA: 1.0000 "application " | TOPICAL_CREAM | Freq: Two times a day (BID) | CUTANEOUS | 4 refills | Status: DC

## 2020-12-23 MED ORDER — AMLODIPINE BESYLATE 5 MG PO TABS: 5.0000 mg | ORAL_TABLET | Freq: Every day | ORAL | 3 refills | Status: DC

## 2020-12-23 MED ORDER — POLYETHYLENE GLYCOL 3350 17 GM/SCOOP PO POWD: ORAL | 11 refills | Status: DC

## 2020-12-23 NOTE — Progress Notes (Addendum)
Subjective:    Patient ID: Tonya Wilson, female   DOB: 01-Jan-1981, 40 y.o.   MRN: [id number]   HPI   CPE without pap  1.  Pap:  10/2019 last pap.  Needs referral to [facility] here in [place]  2.  Mammogram:  Never.  No family of breast cancer.    3.  Osteoprevention: No calcium but does take Vitamin D.  History of low Vitamin D level 2 years ago, but not rechecked since supplemented.  States taking "baking soda" pills from [facility] nephrology.  Takes twice daily.  Suspect phosphate binder.  4.  Guaiac Cards: Never.   5.  Colonoscopy:  Had screening colonoscopy with Dr. Ardis Hughs 10/21/20:  Hyperplastic polyp.  To have follow up in 5 years due to family history.  Maternal grandfather with history of colon cancer, died in his 1s.  His daughter (her aunt) diagnosed with colon polyp.  Maternal half sister with cancerous colon polyps in her late 5s.  6.  Immunizations:   Immunization History  Administered Date(s) Administered   Tdap 11/27/2013  Has not had COVID vaccine and suffered from [place] in April 2022.     7.  Glucose/Cholesterol:  Glucose in past has been fine in past.  Has not had cholesterol checked in some time.  Current Meds  Medication Sig   acetaminophen (TYLENOL) 500 MG tablet Take 1,000 mg by mouth every 6 (six) hours as needed. For pain   amLODipine (NORVASC) 5 MG tablet Take 5 mg by mouth daily.   cabergoline (DOSTINEX) 0.5 MG tablet Take 1 tablet (0.5 mg total) by mouth 2 (two) times a week.   Cholecalciferol (VITAMIN D3) 50 MCG (2000 UT) TABS Take by mouth. 1 daily   cloNIDine (CATAPRES) 0.1 MG tablet TAKE 2 TABLETS BY MOUTH THREE TIMES DAILY   ondansetron (ZOFRAN) 4 MG tablet Take 1 tablet (4 mg total) by mouth every 8 (eight) hours as needed for nausea or vomiting.   Prenatal 27-1 MG TABS TAKE 1 TABLET BY MOUTH DAILY   Rimegepant Sulfate (NURTEC) 75 MG TBDP Take 75 mg by mouth daily as needed (take for abortive therapy of migraine, no more than 1  tablet in 24 hours or 10 per month).   Allergies  Allergen Reactions   Other Other (See Comments)    NO NSAIDS 2/2 CKD PER PT   Flagyl [Metronidazole] Rash   Past Medical History:  Diagnosis Date   Anemia    Asthma    Since birth.   Chronic kidney disease    Headache 1994   Migraine   Heart murmur    Hypertension age 53   Infertility, female    Pituitary adenoma ([place]) 2018   MR 2021 did not show lesion.   Prematurity 1983   delivered by C/S at 7.5 months gestation.  Ventilated for unknown period of time. Had left lung pneumothorax at 3 months with chest tube.   Seasonal allergies child   Uterine leiomyoma 2018   Removed 11/2016   Uterine polyp 2018   Past Surgical History:  Procedure Laterality Date   excision uterine leiomyoma  11/2016   HYSTEROSCOPY  2018   removal of fibroids   LUNG SURGERY  1983   Premie and on ventilator--lung collapse.  Not clear if just a chest tube placed   Family History  Problem Relation Age of Onset   Asthma Mother    Hypertension Mother    Thyroid disease Mother    Bipolar disorder  Mother        Bipolar II   Schizophrenia Father    Bipolar disorder Sister    Hypertension Sister    Anxiety disorder Sister    Colon polyps Sister    Polycystic ovary syndrome Sister    Polycystic ovary syndrome Sister    Colon cancer Maternal Grandfather        25's?   Colon cancer Maternal Aunt 65   Social History   Socioeconomic History   Marital status: Significant Other    Spouse name: Not on file   Number of children: 1   Years of education: Not on file   Highest education level: Associate degree: occupational, Hotel manager, or vocational program  Occupational History   Not on file  Tobacco Use   Smoking status: Former    Packs/day: 0.15    Types: Cigarettes   Smokeless tobacco: Never   Tobacco comments:    Would like to table for now.  Vaping Use   Vaping Use: Never used  Substance and Sexual Activity   Alcohol use: Yes    Comment:  occasional wine with dinner   Drug use: No   Sexual activity: Yes  Other Topics Concern   Not on file  Social History Narrative   Lives at home with 39 yo adopted daughter.   Social Determinants of Health   Financial Resource Strain: Not on file  Food Insecurity: Not on file  Transportation Needs: Not on file  Physical Activity: Not on file  Stress: Not on file  Social Connections: Not on file  Intimate Partner Violence: Not on file     Review of Systems  Constitutional: Negative for fatigue.  Skin:       Eczema seems to be reactivating.   Using Eucerin cream Using Dove Sensitive.   Tried OTC cortisone cream without help.   Calf areas and antecubital fossae and anterior thighs    Psychiatric/Behavioral: Positive for dysphoric mood (at times.). The patient is nervous/anxious (At times.).       Objective:   BP (!) 130/110 (BP Location: Left Arm, Patient Position: Sitting, Cuff Size: Normal)   Pulse 80   Resp 18   Ht 5' 2.75" (1.594 m)   Wt 156 lb 8 oz (71 kg)   BMI 27.94 kg/m   Physical Exam Chaperone present: Deferred to OB/Gyn.  Constitutional:      Appearance: Normal appearance.  HENT:     Head: Normocephalic and atraumatic.     Right Ear: Hearing, tympanic membrane, ear canal and external ear normal.     Left Ear: Hearing, tympanic membrane, ear canal and external ear normal.     Nose: Nose normal.     Mouth/Throat:     Mouth: Mucous membranes are moist.     Pharynx: Oropharynx is clear.  Eyes:     Extraocular Movements: Extraocular movements intact.     Conjunctiva/sclera: Conjunctivae normal.     Pupils: Pupils are equal, round, and reactive to light.     Funduscopic exam:    Right eye: Red reflex present.        Left eye: Red reflex present.    Comments: Discs sharp  Neck:     Thyroid: No thyroid mass or thyromegaly.  Cardiovascular:     Rate and Rhythm: Normal rate and regular rhythm.     Heart sounds: S1 normal and S2 normal. No murmur  heard.   No friction rub. No S3 or S4 sounds.  Comments: Carotid, radial, femoral, DP and PT pulses normal and equal. Pulmonary:     Effort: Pulmonary effort is normal.     Breath sounds: Normal breath sounds.  Chest:  Breasts:    Right: No axillary adenopathy or supraclavicular adenopathy.     Left: No axillary adenopathy or supraclavicular adenopathy.  Abdominal:     General: Bowel sounds are normal.     Palpations: Abdomen is soft. There is no hepatomegaly, splenomegaly or mass.     Tenderness: There is no abdominal tenderness.     Hernia: No hernia is present.  Genitourinary:    Comments: Deferred to OB/Gyn Musculoskeletal:        General: Normal range of motion.     Cervical back: Normal range of motion and neck supple.     Right lower leg: No edema.     Left lower leg: No edema.  Lymphadenopathy:     Head:     Right side of head: No submental or submandibular adenopathy.     Left side of head: No submental or submandibular adenopathy.     Cervical: No cervical adenopathy.     Upper Body:     Right upper body: No supraclavicular or axillary adenopathy.     Left upper body: No supraclavicular or axillary adenopathy.     Lower Body: No right inguinal adenopathy. No left inguinal adenopathy.  Skin:    General: Skin is warm.     Comments: Dry patches in antecubital fossae, anterior thighs and posterior lower legs.  Neurological:     General: No focal deficit present.     Mental Status: She is alert.     Cranial Nerves: Cranial nerves are intact.     Sensory: Sensation is intact.     Motor: Motor function is intact.     Coordination: Coordination is intact.     Gait: Gait is intact.     Deep Tendon Reflexes: Reflexes are normal and symmetric.  Psychiatric:        Attention and Perception: Attention normal.        Mood and Affect: Mood and affect normal.        Speech: Speech normal.        Behavior: Behavior normal. Behavior is cooperative.        Thought Content:  Thought content normal.     Assessment & Plan   CPE Encouraged COVID vaccination Encouraged influenza vaccination in the fall. FLP  2.  Depression/Anxiety:  Would like virtual visits with Dwan Bolt, LCSW--she is not available for a warm hand off today--referral made.  3.  CKD with hypertension:  referral to [facility].  Continue Amlodipine.  4.  Infertility:  referral to [facility].  Continue folic acid 1 mg daily.  5.  History of low Vitamin D:  check level.  6.  Eczema:  Clobetasol cream twice daily as needed.  Eucerin Cream for eczema relief all over.

## 2020-12-24 ENCOUNTER — Telehealth: Payer: Self-pay | Admitting: Internal Medicine

## 2020-12-24 NOTE — Telephone Encounter (Signed)
Patient called to provide Dr. Amil Amen the name of the medication not listed in her mychart- prescribed by her kidney doctor . Medication name- Sodium Bicarbonate 650mg  twice a day

## 2021-01-19 ENCOUNTER — Telehealth: Payer: Self-pay | Admitting: Internal Medicine

## 2021-01-19 NOTE — Telephone Encounter (Signed)
Patient called asking about the referral you were giving her for therapy/counseling. Patient stated that she has not received any call about that and still interested in doing it. Please advise.

## 2021-01-22 NOTE — Telephone Encounter (Signed)
Notified pt that we are having a transition with our LCSW and she will be reaching out to her. Pt wanted to inform the doctor that she has had increase anxiety. She has been experiencing panic attacks for 2 week. First one during her vacation and most recent at work on Monday. She feels tightness in her chest and her body feels warm. She is not taking any additional medication for these recent issues.

## 2021-01-23 ENCOUNTER — Telehealth: Payer: Self-pay | Admitting: Internal Medicine

## 2021-01-23 DIAGNOSIS — F419 Anxiety disorder, unspecified: Secondary | ICD-10-CM | POA: Insufficient documentation

## 2021-01-23 DIAGNOSIS — F410 Panic disorder [episodic paroxysmal anxiety] without agoraphobia: Secondary | ICD-10-CM

## 2021-01-23 DIAGNOSIS — F32A Depression, unspecified: Secondary | ICD-10-CM

## 2021-01-23 MED ORDER — CLONAZEPAM 1 MG PO TABS
ORAL_TABLET | ORAL | 0 refills | Status: DC
Start: 2021-01-23 — End: 2021-04-28

## 2021-01-23 MED ORDER — ESCITALOPRAM OXALATE 10 MG PO TABS: 10.0000 mg | ORAL_TABLET | Freq: Every day | ORAL | 3 refills | Status: DC

## 2021-01-23 NOTE — Telephone Encounter (Signed)
Ms. Bores responding to my call back yesterday.   She mentioned she was having some depression/anxiety issues beginning of June, but now having panic attacks frequently.  Have not yet set her up with counseling, which she is interested in.   Dwan Bolt, LCSW is planning to speak with her about options going forward as she is leaving mid July Ms. Messman has a history of panic disorder when living in [place] and after moving to [place].   She took Prozac, which she did not tolerate well, and Zoloft, which was not effective.  Did take Lexapro just as leaving [place] and for 2 years after coming to [facility] and felt this worked well for her. She is stressed at work and has had to take time off because of this. She has not been seen by infertility yet to start another round and so will be a bit before she is contemplating the possibility of pregnancy. Escitalopram 10 mg daily with as needed Clonazepam for now.   Discussed would not fill another Rx of Clonazepam--just a bridge until Escitalopram is working for her and to get counseling started.   She will call progress next Friday, the 6th and get set up with follow up in 6 weeks.   She should also speak with OB/fertility about risks to fetus while taking Escitalopram--went over possible risks today, but would be good for her to get the OB's experience with these outcomes on the medication.

## 2021-01-26 ENCOUNTER — Telehealth: Payer: Self-pay | Admitting: Clinical

## 2021-01-26 NOTE — Telephone Encounter (Addendum)
LCSW contacted as a referral from PCP however due to LCSW leaving, contacted to discuss options. Left voicemail.   Patient called back. LCSW discussed referral and asked if she wanted only virtual. Discussed option to be placed on list for next social worker to contact her to begin treatment or if she would like to be referred elsewhere. Patient reported due to not knowing when next social worker will be available she would prefer to be referred. LCSW confirmed email in EHR, LCSW sent patient list of agencies/practices, in email also informed another option to contact her insurance for assistance to locate a counselor. Patient expressed understanding that she will be receiving an email from [facility]. LCSW read patient other phone call messages. Left note on receptionist desk.

## 2021-01-26 NOTE — Telephone Encounter (Signed)
Called patient and left a message asking to call back to schedule appointments recommended by Dr. Amil Amen.

## 2021-01-29 ENCOUNTER — Telehealth: Payer: BC Managed Care – PPO | Admitting: Internal Medicine

## 2021-02-09 ENCOUNTER — Other Ambulatory Visit: Payer: Self-pay | Admitting: Internal Medicine

## 2021-02-09 DIAGNOSIS — I151 Hypertension secondary to other renal disorders: Secondary | ICD-10-CM

## 2021-02-17 ENCOUNTER — Encounter: Payer: Self-pay | Admitting: Family Medicine

## 2021-02-18 DIAGNOSIS — F411 Generalized anxiety disorder: Secondary | ICD-10-CM | POA: Diagnosis not present

## 2021-02-18 DIAGNOSIS — F331 Major depressive disorder, recurrent, moderate: Secondary | ICD-10-CM | POA: Diagnosis not present

## 2021-02-22 ENCOUNTER — Other Ambulatory Visit: Payer: Self-pay

## 2021-02-22 ENCOUNTER — Ambulatory Visit (INDEPENDENT_AMBULATORY_CARE_PROVIDER_SITE_OTHER): Payer: BC Managed Care – PPO

## 2021-02-22 ENCOUNTER — Telehealth: Payer: Self-pay

## 2021-02-22 VITALS — BP 87/68 | HR 80

## 2021-02-22 DIAGNOSIS — Z0130 Encounter for examination of blood pressure without abnormal findings: Secondary | ICD-10-CM

## 2021-02-22 NOTE — Telephone Encounter (Signed)
Pt reported having a panic attack 02/19/21 and was unable to get to work. She came in 02/22/21 to get her vitals check and found her bp to be very low. Pt has described fluctuation in her bp. Already has an appointment 03/10/21.

## 2021-02-23 NOTE — Telephone Encounter (Signed)
NOtify may still have some panic episodes still--the goal is to cut them down and decrease the severity and may take 6-8 weeks to see what the dose she is taking of Citalopram does. To keep checking her bp when she feels there is a problem--needs to give [place] definite symptoms as to what she feels when she thinks her bp is low or high for that matter.

## 2021-02-24 DIAGNOSIS — F331 Major depressive disorder, recurrent, moderate: Secondary | ICD-10-CM | POA: Diagnosis not present

## 2021-02-24 DIAGNOSIS — F411 Generalized anxiety disorder: Secondary | ICD-10-CM | POA: Diagnosis not present

## 2021-02-24 NOTE — Telephone Encounter (Signed)
Pt notified. Pt will continue documenting bp and symptoms before her next visit.   Pt mentioned that during her urgent care visit, they suggested that the panic attack could be side effect to having had COVID a few months ago.

## 2021-03-03 DIAGNOSIS — F331 Major depressive disorder, recurrent, moderate: Secondary | ICD-10-CM | POA: Diagnosis not present

## 2021-03-03 DIAGNOSIS — F411 Generalized anxiety disorder: Secondary | ICD-10-CM | POA: Diagnosis not present

## 2021-03-10 ENCOUNTER — Ambulatory Visit (INDEPENDENT_AMBULATORY_CARE_PROVIDER_SITE_OTHER): Payer: BC Managed Care – PPO | Admitting: Internal Medicine

## 2021-03-10 ENCOUNTER — Encounter: Payer: Self-pay | Admitting: Internal Medicine

## 2021-03-10 ENCOUNTER — Other Ambulatory Visit: Payer: Self-pay

## 2021-03-10 VITALS — BP 124/90 | HR 48 | Resp 16 | Ht 62.0 in | Wt 152.0 lb

## 2021-03-10 DIAGNOSIS — F419 Anxiety disorder, unspecified: Secondary | ICD-10-CM | POA: Diagnosis not present

## 2021-03-10 DIAGNOSIS — F331 Major depressive disorder, recurrent, moderate: Secondary | ICD-10-CM | POA: Diagnosis not present

## 2021-03-10 DIAGNOSIS — F32A Depression, unspecified: Secondary | ICD-10-CM | POA: Diagnosis not present

## 2021-03-10 DIAGNOSIS — F410 Panic disorder [episodic paroxysmal anxiety] without agoraphobia: Secondary | ICD-10-CM | POA: Diagnosis not present

## 2021-03-10 DIAGNOSIS — F411 Generalized anxiety disorder: Secondary | ICD-10-CM | POA: Diagnosis not present

## 2021-03-10 MED ORDER — ESCITALOPRAM OXALATE 20 MG PO TABS: 10.0000 mg | ORAL_TABLET | Freq: Every day | ORAL | 11 refills | Status: DC

## 2021-03-10 NOTE — Progress Notes (Addendum)
Subjective:    Patient ID: Tonya Wilson, female   DOB: 05/10/1981, 40 y.o.   MRN: [id number]   HPI   Panic Disorder:  had a bad episode on July 30th.  Very overstimulated.  Felt very anxious.  Always loud in call center.  Heart started racing.  She had to stay as is on attendance write up.  Went home and crashed--slept the whole day.  Awakened 24 hours later and was slow to stand, but felt light headed and thought she would pass out.  Tried again a bit later and same.  She called out of work.  Slept again all day and when awakened the same way.  Came here on 02/22/21 and BP low at 87/68.  Had held her Clonidine the day before.  Rested the rest of the week and bp gradually came back up.  She restarted her Clonidine once she was in the 120/80 range.   She is working with Antoine Primas with [facility].  She works with her once weekly.  Has been working with her for 3 weeks.  Work is with "decompression of the mind"  Also on calming techniques.   Having anxiety every day.  Panic attacks are when she really feels overwhelmed--generally once weekly. Has taken Clonazepam at work, but makes her really sleepy.  Also at nighttime on Thursday before work the next day when call center very stressful to calm her racing thoughts.   Current Meds  Medication Sig   acetaminophen (TYLENOL) 500 MG tablet Take 1,000 mg by mouth every 6 (six) hours as needed. For pain   amLODipine (NORVASC) 5 MG tablet Take 1 tablet (5 mg total) by mouth daily.   cabergoline (DOSTINEX) 0.5 MG tablet Take 1 tablet (0.5 mg total) by mouth 2 (two) times a week.   Cholecalciferol (VITAMIN D3) 50 MCG (2000 UT) TABS Take by mouth. 1 daily   clobetasol cream (TEMOVATE) [id number]-123456789 % Apply 1 application topically 2 (two) times daily.   clonazePAM (KLONOPIN) 1 MG tablet 1/2 to 1 tab by mouth twice daily only as needed for anxiety   cloNIDine (CATAPRES) 0.1 MG tablet TAKE 2 TABLETS BY MOUTH THREE TIMES DAILY   escitalopram (LEXAPRO) 10  MG tablet Take 1 tablet (10 mg total) by mouth daily.   folic acid (FOLVITE) 1 MG tablet Take 1 tablet (1 mg total) by mouth daily.   ondansetron (ZOFRAN) 4 MG tablet Take 1 tablet (4 mg total) by mouth every 8 (eight) hours as needed for nausea or vomiting.   polyethylene glycol powder (MIRALAX) 17 GM/SCOOP powder 17 g in 8 ounces water or juice once to twice daily   Rimegepant Sulfate (NURTEC) 75 MG TBDP Take 75 mg by mouth daily as needed (take for abortive therapy of migraine, no more than 1 tablet in 24 hours or 10 per month).   Allergies  Allergen Reactions   Other Other (See Comments)    NO NSAIDS 2/2 CKD PER PT   Flagyl [Metronidazole] Rash     Review of Systems    Objective:   BP 124/90 (BP Location: Right Arm, Patient Position: Sitting, Cuff Size: Normal)   Pulse (!) 48   Resp 16   Ht '5\' 2"'$  (1.575 m)   Wt 152 lb (68.9 kg)   BMI 27.80 kg/m   Physical Exam NAD Anxious when describing her work place environment Lungs:  CTA CV:  RRR without murmur or rub.  Radial pulses normal and equal.   LE:  No edema.   Assessment & Plan    Anxiety and Depression with Panic Disorder:  Biggest trigger of the latter at this point is with her noisy and crowded work environment.  Has paperwork for workplace absences/FMLA to fill out.  Discussed she feels she could work without such frequent panic attacks if working from home, which some employees reportedly are still doing.  Needs to be completed and faxed by 03/16/2021.  Increased Escitalopram to 20 mg daily.  Follow up in 2 months.  2.  Migraines:  as per Neurology:  work place accommodations paperwork filled out by Neurology for this as well.  3.  Recent low BP with panic episodes:  improved, though diastolic today a bit up.

## 2021-03-15 MED ORDER — ESCITALOPRAM OXALATE 20 MG PO TABS: 20.0000 mg | ORAL_TABLET | Freq: Every day | ORAL | 11 refills | Status: DC

## 2021-03-17 DIAGNOSIS — F411 Generalized anxiety disorder: Secondary | ICD-10-CM | POA: Diagnosis not present

## 2021-03-17 DIAGNOSIS — F331 Major depressive disorder, recurrent, moderate: Secondary | ICD-10-CM | POA: Diagnosis not present

## 2021-03-24 DIAGNOSIS — F331 Major depressive disorder, recurrent, moderate: Secondary | ICD-10-CM | POA: Diagnosis not present

## 2021-03-24 DIAGNOSIS — F411 Generalized anxiety disorder: Secondary | ICD-10-CM | POA: Diagnosis not present

## 2021-04-06 ENCOUNTER — Telehealth: Payer: Self-pay | Admitting: Internal Medicine

## 2021-04-06 NOTE — Telephone Encounter (Signed)
Pt called asking if she is able to get a letter written by Dr. Amil Amen to her employer for a job accomodation. 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 Her employer asked for a letter from her PCP.

## 2021-04-26 NOTE — Telephone Encounter (Signed)
Pt called and reported she had 3 severe panic attacks last week and wants to know if there is any other anxiety medication she can start.

## 2021-04-27 ENCOUNTER — Other Ambulatory Visit: Payer: Self-pay | Admitting: Nephrology

## 2021-04-27 DIAGNOSIS — N1832 Chronic kidney disease, stage 3b: Secondary | ICD-10-CM

## 2021-04-28 ENCOUNTER — Other Ambulatory Visit: Payer: Self-pay

## 2021-04-28 ENCOUNTER — Encounter: Payer: Self-pay | Admitting: Internal Medicine

## 2021-04-28 ENCOUNTER — Ambulatory Visit (INDEPENDENT_AMBULATORY_CARE_PROVIDER_SITE_OTHER): Payer: BC Managed Care – PPO | Admitting: Internal Medicine

## 2021-04-28 VITALS — BP 102/80 | HR 60 | Resp 16 | Ht 62.0 in | Wt 155.0 lb

## 2021-04-28 DIAGNOSIS — F419 Anxiety disorder, unspecified: Secondary | ICD-10-CM | POA: Diagnosis not present

## 2021-04-28 DIAGNOSIS — G4700 Insomnia, unspecified: Secondary | ICD-10-CM

## 2021-04-28 DIAGNOSIS — F410 Panic disorder [episodic paroxysmal anxiety] without agoraphobia: Secondary | ICD-10-CM | POA: Diagnosis not present

## 2021-04-28 DIAGNOSIS — F32A Depression, unspecified: Secondary | ICD-10-CM

## 2021-04-28 MED ORDER — CLONAZEPAM 1 MG PO TABS: ORAL_TABLET | ORAL | 0 refills | Status: DC

## 2021-04-28 MED ORDER — TRAZODONE HCL 50 MG PO TABS: ORAL_TABLET | ORAL | 11 refills | Status: DC

## 2021-04-28 MED ORDER — ESCITALOPRAM OXALATE 20 MG PO TABS: ORAL_TABLET | ORAL | 11 refills | Status: DC

## 2021-04-28 NOTE — Progress Notes (Signed)
    Subjective:    Patient ID: Tonya Wilson, female   DOB: [birth year], 40 y.o.   MRN: [id number]   HPI  Depression/anxiety/panic disorder:  Ran out of Clonazepam.  She continues to have anxiety and difficulty sleeping.  Is awakening every night.  No nightmares.  Continues to take the Escitalopram 20 mg daily.   Still struggling with noise and anxiety at work.  Will not allow her to work from home--others have asked and turned down..      Current Meds  Medication Sig   acetaminophen (TYLENOL) 500 MG tablet Take 1,000 mg by mouth every 6 (six) hours as needed. For pain   amLODipine (NORVASC) 5 MG tablet Take 1 tablet (5 mg total) by mouth daily.   cabergoline (DOSTINEX) 0.5 MG tablet Take 1 tablet (0.5 mg total) by mouth 2 (two) times a week.   Cholecalciferol (VITAMIN D3) 50 MCG (2000 UT) TABS Take by mouth. 1 daily   clobetasol cream (TEMOVATE) 6.78 % Apply 1 application topically 2 (two) times daily.   cloNIDine (CATAPRES) 0.1 MG tablet TAKE 2 TABLETS BY MOUTH THREE TIMES DAILY   escitalopram (LEXAPRO) 20 MG tablet Take 1 tablet (20 mg total) by mouth daily.   folic acid (FOLVITE) 1 MG tablet Take 1 tablet (1 mg total) by mouth daily.   ondansetron (ZOFRAN) 4 MG tablet Take 1 tablet (4 mg total) by mouth every 8 (eight) hours as needed for nausea or vomiting.   polyethylene glycol powder (MIRALAX) 17 GM/SCOOP powder 17 g in 8 ounces water or juice once to twice daily   Rimegepant Sulfate (NURTEC) 75 MG TBDP Take 75 mg by mouth daily as needed (take for abortive therapy of migraine, no more than 1 tablet in 24 hours or 10 per month).   Allergies  Allergen Reactions   Other Other (See Comments)    NO NSAIDS 2/2 CKD PER PT   Flagyl [Metronidazole] Rash     Review of Systems    Objective:   BP 102/80 (BP Location: Left Arm, Patient Position: Sitting, Cuff Size: Normal)   Pulse 60   Resp 16   Ht 5\' 2"  (1.575 m)   Wt 155 lb (70.3 kg)   LMP 04/19/2021   BMI 28.35 kg/m    Physical Exam Anxious  Lungs:  CTA CV:  RRR without murmur or rub.    Assessment & Plan    Depression/anxiety/panic disorder:/insomnia  Still triggering with noise at work.  Letter to be allowed to use noise canceling earphones at work.   Increase Escitalopram to 30 mg daily.   Trazodone 25 mg at bedtime. One more Rx for Clonazepam--discussed again would not provide long term.

## 2021-04-28 NOTE — Telephone Encounter (Signed)
The clonazepam was a one time prescription.  Apparently the escitalopram is not working as well.  We can make her an acute visit for this  Will also write a note for her to use noise reducing earphones/headphones

## 2021-04-28 NOTE — Telephone Encounter (Signed)
Appt made

## 2021-05-06 ENCOUNTER — Telehealth: Payer: Self-pay

## 2021-05-06 NOTE — Telephone Encounter (Signed)
Pt called to give update on Trazodone. Pt expressed that it has helped her sleep, but is still working on the times to take it. Previous took the Rx at 8pm, but struggled to get up in the morning. Plan to take rx at 7:30pm. She is taking it regularly.  Also wanted to report that her job is asking for more paperwork to approve the noise cancelling headphones and paperwork will be faxed to [facility]. Patient also spoke with her therapist and she may push for short term disability if job continues to make it hard to get the headphones

## 2021-05-19 ENCOUNTER — Encounter: Payer: Self-pay | Admitting: Internal Medicine

## 2021-05-19 ENCOUNTER — Ambulatory Visit: Payer: BC Managed Care – PPO | Admitting: Internal Medicine

## 2021-05-19 NOTE — Telephone Encounter (Signed)
Spoke with patient over the weekend.  She will be bringing or having faxed in, paperwork for short term disability

## 2021-05-21 NOTE — Telephone Encounter (Signed)
Pt sent an email with the paperwork for short term disability.

## 2021-05-26 ENCOUNTER — Ambulatory Visit
Admission: RE | Admit: 2021-05-26 | Discharge: 2021-05-26 | Disposition: A | Discharge status: Home / Self Care | Payer: BC Managed Care – PPO | Source: Ambulatory Visit | Attending: Nephrology | Admitting: Nephrology

## 2021-05-26 DIAGNOSIS — N1832 Chronic kidney disease, stage 3b: Secondary | ICD-10-CM

## 2021-05-26 DIAGNOSIS — G4700 Insomnia, unspecified: Secondary | ICD-10-CM | POA: Insufficient documentation

## 2021-05-27 ENCOUNTER — Ambulatory Visit (INDEPENDENT_AMBULATORY_CARE_PROVIDER_SITE_OTHER): Payer: BC Managed Care – PPO | Admitting: Internal Medicine

## 2021-05-27 ENCOUNTER — Other Ambulatory Visit: Payer: Self-pay

## 2021-05-27 VITALS — BP 110/82 | HR 68 | Resp 12 | Ht 62.0 in | Wt 153.0 lb

## 2021-05-27 DIAGNOSIS — F32A Depression, unspecified: Secondary | ICD-10-CM | POA: Diagnosis not present

## 2021-05-27 DIAGNOSIS — F419 Anxiety disorder, unspecified: Secondary | ICD-10-CM

## 2021-05-27 DIAGNOSIS — G4700 Insomnia, unspecified: Secondary | ICD-10-CM | POA: Diagnosis not present

## 2021-05-27 DIAGNOSIS — F410 Panic disorder [episodic paroxysmal anxiety] without agoraphobia: Secondary | ICD-10-CM | POA: Diagnosis not present

## 2021-05-27 MED ORDER — TRAZODONE HCL 50 MG PO TABS: ORAL_TABLET | ORAL | 11 refills | Status: DC

## 2021-05-27 NOTE — Progress Notes (Signed)
Subjective:    Patient ID: Tonya Wilson, female   DOB: 28-Feb-1981, 40 y.o.   MRN: [id number]   HPI   Panic Disorder/GAD/Depression:  Called 05/16/2021 with concerns she just was not doing well with her current work position with noise and being in a room with many people.  She apparently was never given the go ahead to use her noise counseling earphones.  Her counselor, Chesley Noon, concurs she needs a break to get her panic disorder/GAD/Depression under better control with counseling and perhaps medication adjustment, which we have been gradually increasing since 01/23/2021 when initiated for first time.  Here today with planned follow up.  She is now developing issues leaving her home for extended period of time.   Continues with insomnia--wakes at about 3 a.m. and cannot get to sleep, even with Trazodone start last month and increase to 30 mg of Escitalopram.  Denies suicidal or homicidal ideation.  She is requiring dosing of Clonazepam when leaves home more often.  Will also need to leave work and go home to take Clonazepam as well.    2.  Hypertension with CKD:  controlled.    3. Neck pain:  over C7.  Sleeps on side in fetal position.  Current Meds  Medication Sig   acetaminophen (TYLENOL) 500 MG tablet Take 1,000 mg by mouth every 6 (six) hours as needed. For pain   amLODipine (NORVASC) 5 MG tablet Take 1 tablet (5 mg total) by mouth daily.   cabergoline (DOSTINEX) 0.5 MG tablet Take 1 tablet (0.5 mg total) by mouth 2 (two) times a week.   Cholecalciferol (VITAMIN D3) 50 MCG (2000 UT) TABS Take by mouth. 1 daily   clobetasol cream (TEMOVATE) 4.17 % Apply 1 application topically 2 (two) times daily.   clonazePAM (KLONOPIN) 1 MG tablet 1/2 to 1 tab by mouth twice daily only as needed for anxiety   cloNIDine (CATAPRES) 0.1 MG tablet TAKE 2 TABLETS BY MOUTH THREE TIMES DAILY   escitalopram (LEXAPRO) 20 MG tablet 1 1/2 tabs by mouth daily (Patient taking differently: 30 mg. 1 1/2  tabs by mouth daily)   folic acid (FOLVITE) 1 MG tablet Take 1 tablet (1 mg total) by mouth daily.   ondansetron (ZOFRAN) 4 MG tablet Take 1 tablet (4 mg total) by mouth every 8 (eight) hours as needed for nausea or vomiting.   polyethylene glycol powder (MIRALAX) 17 GM/SCOOP powder 17 g in 8 ounces water or juice once to twice daily (Patient taking differently: as needed. 17 g in 8 ounces water or juice once to twice daily)   Rimegepant Sulfate (NURTEC) 75 MG TBDP Take 75 mg by mouth daily as needed (take for abortive therapy of migraine, no more than 1 tablet in 24 hours or 10 per month).   sodium bicarbonate 650 MG tablet Take 650 mg by mouth 2 (two) times daily.   traZODone (DESYREL) 50 MG tablet 1/2 tab by mouth at bedtime   Allergies  Allergen Reactions   Other Other (See Comments)    NO NSAIDS 2/2 CKD PER PT   Flagyl [Metronidazole] Rash     Review of Systems    Objective:   BP 110/82 (BP Location: Left Arm, Patient Position: Sitting, Cuff Size: Normal)   Pulse 68   Resp 12   Ht 5\' 2"  (1.575 m)   Wt 153 lb (69.4 kg)   LMP 05/21/2021 (Exact Date)   BMI 27.98 kg/m   Physical Exam Becomes anxious appearing when  discussing her symptoms of panic at work and at times now, leaving home. Appears somewhat fatigued.  HEENT:  PERRL, EOMI Neck:  Supple, tender over traps and over C7 spinous process--mild.  Cervical paraspinous musculature is tight.  No adenopathy Chest:  CTA CV:  RRR without murmur or rub.  Radial pulses normal and equal. Neuro:  UE:  motor 5/5.       Assessment & Plan    Worsening Depression/Anxiety/Panic Disorder/Insomnia and now what sounds like the development of agoraphobia.  Needs time off from work as that is her primary trigger for panic attacks and leading to increased generalize symptoms.  With continued counseling and perhaps increased sleep and removal from her main trigger, perhaps the combination in treatment will allow for improvement .  Has  previously asked for accommodations and has not full received, including noise cancelling earphones and the ability to work from home.  Address insomnia with increase of Trazodone up to 100 mg at bedtime--to titrate by 25 to 50 mg to that dose every 2 days as needed.  2.  C7 tenderness and neck discomfort:  discussed improved sleeping position with pillows as well.    Follow up beginning of December--keep already scheduled appt. Marland Kitchen

## 2021-06-25 ENCOUNTER — Telehealth: Payer: BC Managed Care – PPO | Admitting: Internal Medicine

## 2021-07-02 ENCOUNTER — Ambulatory Visit (INDEPENDENT_AMBULATORY_CARE_PROVIDER_SITE_OTHER): Payer: Self-pay | Admitting: Internal Medicine

## 2021-07-02 ENCOUNTER — Other Ambulatory Visit: Payer: Self-pay

## 2021-07-02 DIAGNOSIS — G4700 Insomnia, unspecified: Secondary | ICD-10-CM

## 2021-07-02 DIAGNOSIS — F410 Panic disorder [episodic paroxysmal anxiety] without agoraphobia: Secondary | ICD-10-CM
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
## 2021-07-14 ENCOUNTER — Telehealth: Payer: Self-pay

## 2021-07-14 NOTE — Telephone Encounter (Signed)
I submitted a PA request for Nurtec on CMM, Key: [id number]. Received approval.   [id number];[id number];Review Type:Prior Auth;Coverage Start Date:06/14/2021;Coverage End Date:07/14/2022

## 2021-10-12 ENCOUNTER — Ambulatory Visit: Payer: BC Managed Care – PPO | Admitting: Internal Medicine

## 2021-11-02 IMAGING — US US RENAL
1 series · 14 of 25 positions shown · non-contrast
Comparison: None.

CLINICAL DATA: Stage III B chronic renal disease.

EXAM:
RENAL / URINARY TRACT ULTRASOUND COMPLETE

[Series 1: us renal · 0.18mm/px · 14 of 27 slices shown]
[im 1/27]
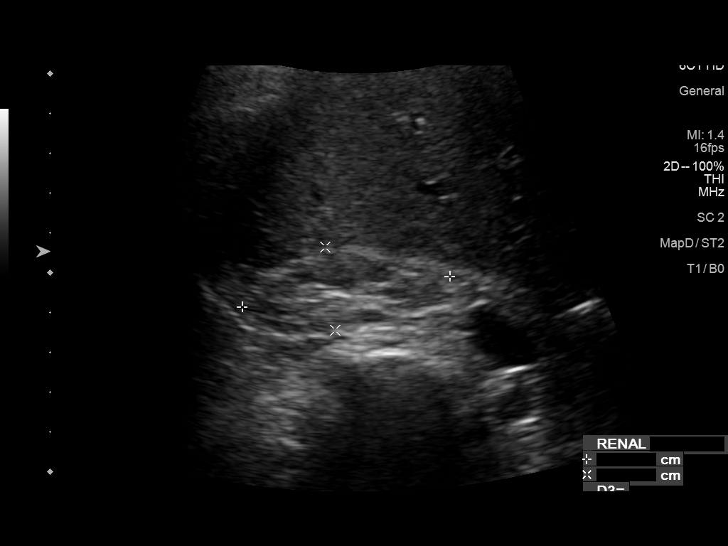
[im 3/27]
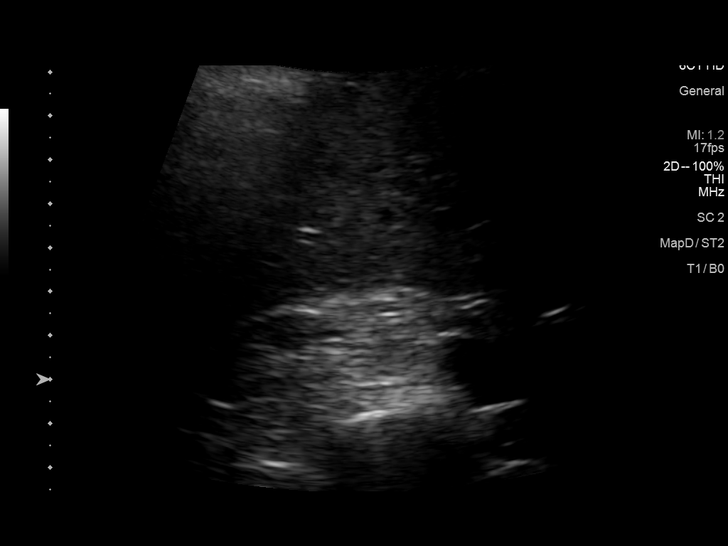
[im 5/27]
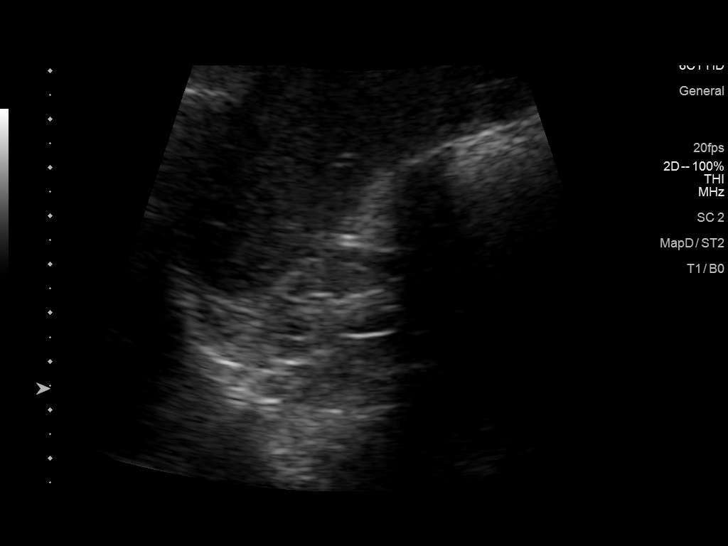
[im 7/27]
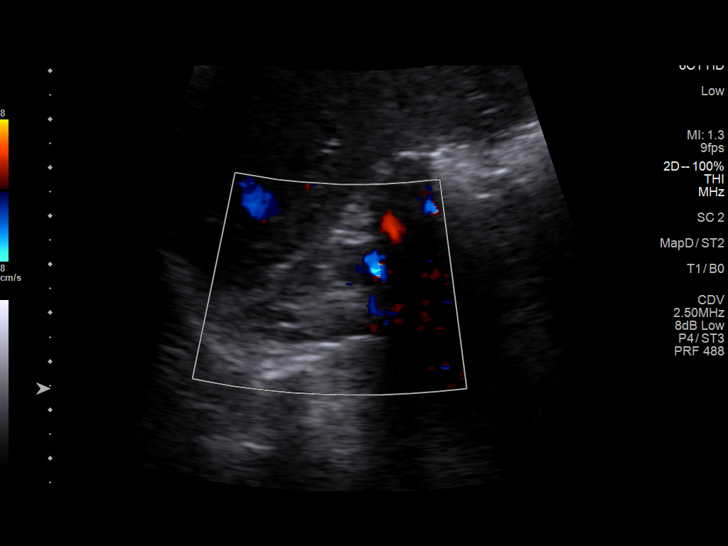
[im 9/27]
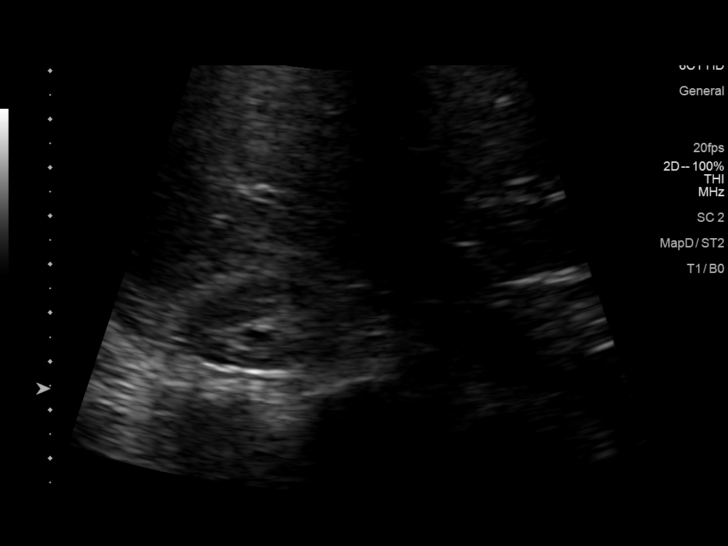
[im 10/27]
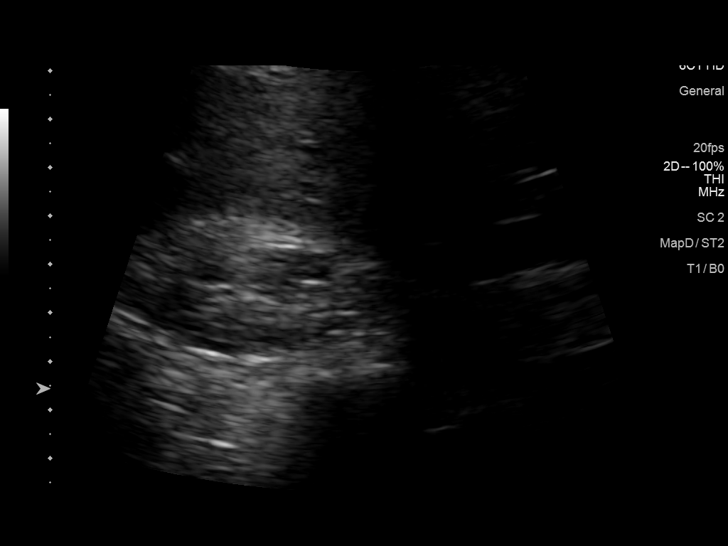
[im 12/27]
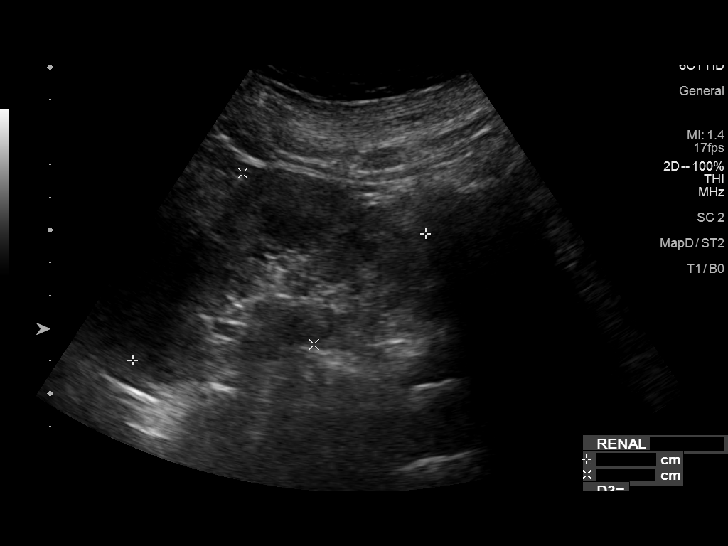
[im 15/27]
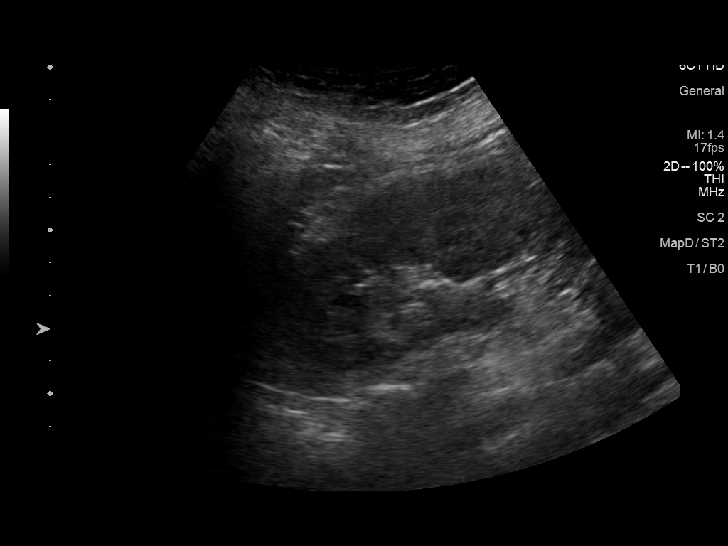
[im 17/27]
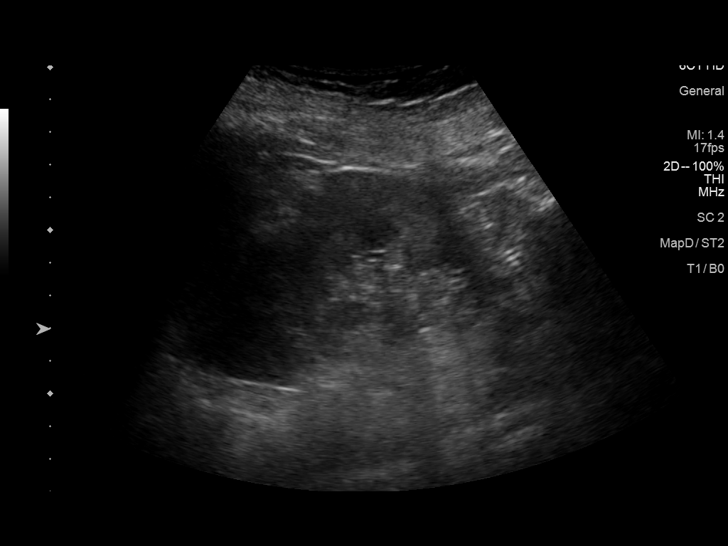
[im 18/27]
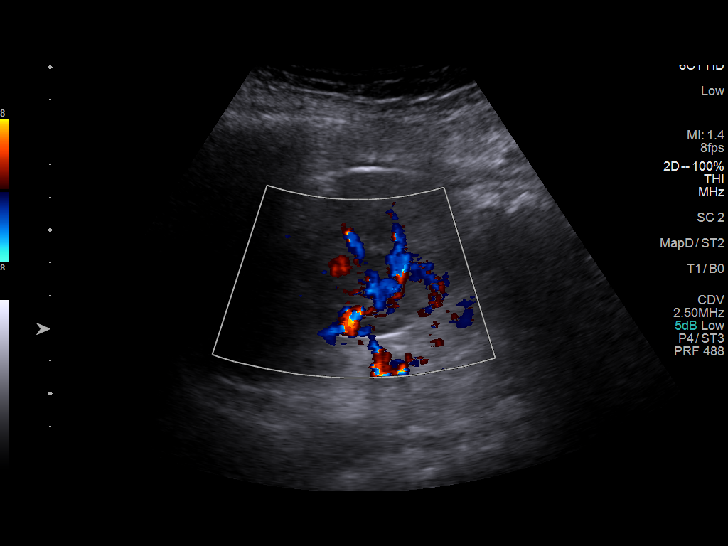
[im 20/27]
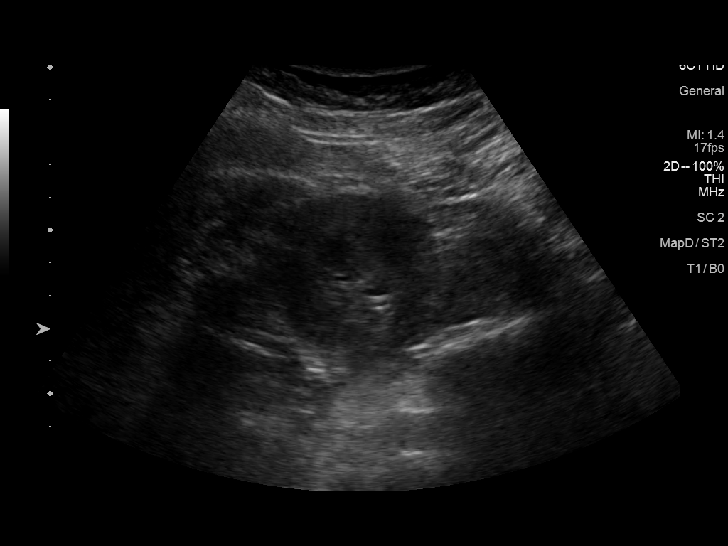
[im 22/27]
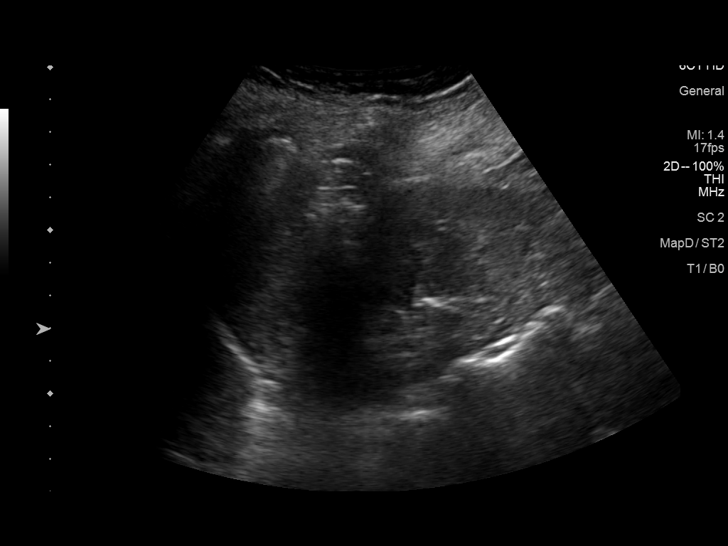
[im 24/27]
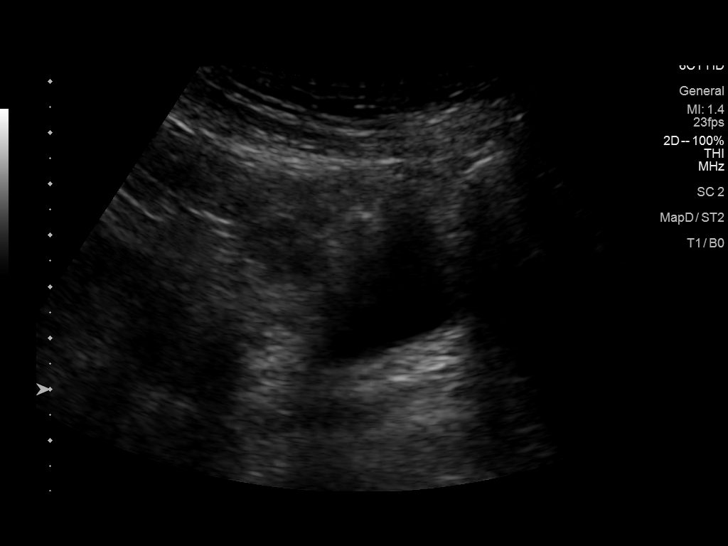
[im 27/27]
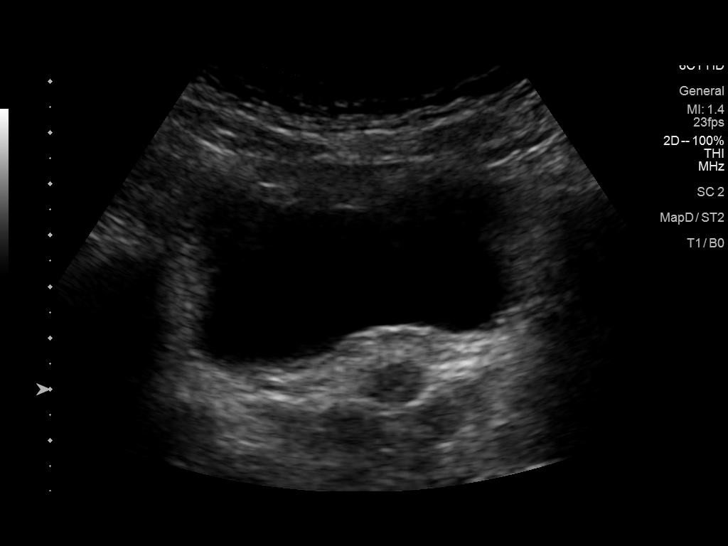

[14 of 25 positions shown; findings below may reference images not displayed]

FINDINGS: Right Kidney:

Renal measurements: 5.3 cm x 2.1 cm x 2.0 cm = volume: 11.4 mL.
Echogenicity within normal limits. No mass or hydronephrosis
visualized.

Left Kidney:

Renal measurements: 9.8 cm x 5.7 cm x 5.1 cm = volume: 147.2 mL.
Echogenicity within normal limits. No mass or hydronephrosis
visualized.

Bladder:

Appears normal for degree of bladder distention.

Other:

None.
IMPRESSION: 1. Small right kidney.
2. Otherwise unremarkable renal ultrasound.

## 2021-11-24 NOTE — Patient Instructions (Incomplete)

## 2021-11-24 NOTE — Progress Notes (Deleted)
No chief complaint on file.   HISTORY OF PRESENT ILLNESS:  11/24/21 ALL: [place] returns for follow up for migraines. We last saw her 11/2020 and discontinued Emgality. Although effective, she wished to conceive. We continued Nurtec PRN. She send message 01/2021 reporting migraines remained well managed. Counseling was helping with stress. Since,   11/25/2020 ALL:  She returns for follow up for migraines. We switched her to [place] (from [place]) due to continued headaches. Roselyn Meier was not effective. Nurtec worked better for abortive therapy but makes her sick on her stomach. She can not take triptans due to kidney disease. She reports having about 7 migraine days per month. Nurtec does ease pain.   She is followed by Dr Loanne Drilling with [facility]. 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 She reported desire for pregnancy at follow up 09/10/20. She reports having IVF treatments over a year ago. She is not on birth control. She was hoping to return in June for fertility treatments.    05/28/2020 ALL:  Loletta Harper is a 41 y.o. female here today for follow up for headaches and pituitary adenoma on cabergoline. Last MRI w/o contrast 02/2020 did not show evidence of tumor. She was started on Ajovy for migraine prevention. She has taken 3 injections and does not feel that it has helped at all. She continues to have about 6-10 headache days per month. Most are throbbing in nature. Some sharp and stabbing headaches. She can not take triptans or Nsaids due to kidney disease. She is using Tylenol but does not feel it helps. She admits that stress levels are most likely contributor outside of menstrual migraines. 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 She is sleeping well. She had a normal eye exam. She is thinking about getting blue light glasses as she spends a lot of time on her computer.    HISTORY (copied from Dr Dohmeier's note on 02/26/2020)  Nadalee Neiswender is a 41  year old African American female patient seen here upon referral on 02/26/2020 from Dr. Amil Amen for migraine/ headaches in increasing frequency.  Chief concern according to patient :  " I have the pleasure of meeting Mrs. Osland today on 26 February 2020 she states that she has had irregular and sometimes very severe menstrual cycles occasionally with a lot of blood loss since at least 2012 she had moved to the [place] area from [place].  Here she underwent fertility treatments and recent work-up she was found to have high prolactin levels.  Following the high prolactin levels an MRI of the brain was ordered through the [facility] system, which confirmed the presence of an adenoma.  The patient has increasing frequency of migraines and nonmigrainous headaches and would like to be evaluated.   The MRI that confirmed a pituitary tumor was performed in 2018.  She is treated for hypertension with amlodipine and clonidine, which also helps her to sleep.  She is a former smoker, she does not drink alcohol, and she does not use caffeine.   I have the pleasure of seeing Ryland Smoots today, a right-handed [place] or [place] American female with a possible sleep disorder.  She has a  has a past medical history of Asthma, Headache, Hypertension (age 56), Infertility, female, Prematurity (1983), Seasonal allergies (child), Uterine leiomyoma (2018), and Uterine polyp (2018).. She sleeps just fine on CLONIDINE she stated.    Headaches have evolved - she began taking birth control pills to regulate  her cycle in 2017-2018 , and she noted that headaches were alway worse 2-3 days prior to ovulation and again worsening 2-3 days over the time of menses.  Dr Rolin Barry is her physician. She had high blood pressure and had to come off Birth control. She had surgery in April for fibroidectomy,  But her headache did not change.  She feels as if she always has a tension or pressure , but sometimes sharp  stabbing sensations for seconds on the left temple.  These will make her flush and tear up. Sometimes she feels as if cold ice water is poured over there head.  She had severe migraines since age 6 or 56.  She reports photophobia - can't drive at night. Eye sight was tested and is excellent. Never waking her up at night  sometimes wakes up with headaches.     REVIEW OF SYSTEMS: Out of a complete 14 system review of symptoms, the patient complains only of the following symptoms, headaches, anxiety and all other reviewed systems are negative.   ALLERGIES: Allergies  Allergen Reactions   Other Other (See Comments)    NO NSAIDS 2/2 CKD PER PT   Flagyl [Metronidazole] Rash     HOME MEDICATIONS: Outpatient Medications Prior to Visit  Medication Sig Dispense Refill   acetaminophen (TYLENOL) 500 MG tablet Take 1,000 mg by mouth every 6 (six) hours as needed. For pain     amLODipine (NORVASC) 5 MG tablet Take 1 tablet (5 mg total) by mouth daily. 90 tablet 3   cabergoline (DOSTINEX) 0.5 MG tablet Take 1 tablet (0.5 mg total) by mouth 2 (two) times a week. 26 tablet 3   Cholecalciferol (VITAMIN D3) 50 MCG (2000 UT) TABS Take by mouth. 1 daily     clobetasol cream (TEMOVATE) 7.82 % Apply 1 application topically 2 (two) times daily. 30 g 4   clonazePAM (KLONOPIN) 1 MG tablet 1/2 to 1 tab by mouth twice daily only as needed for anxiety (Patient not taking: Reported on 07/02/2021) 15 tablet 0   cloNIDine (CATAPRES) 0.1 MG tablet TAKE 2 TABLETS BY MOUTH THREE TIMES DAILY 180 tablet 11   escitalopram (LEXAPRO) 20 MG tablet 1 1/2 tabs by mouth daily (Patient taking differently: 30 mg. 1 1/2 tabs by mouth daily) 45 tablet 11   folic acid (FOLVITE) 1 MG tablet Take 1 tablet (1 mg total) by mouth daily. 90 tablet 3   ondansetron (ZOFRAN) 4 MG tablet Take 1 tablet (4 mg total) by mouth every 8 (eight) hours as needed for nausea or vomiting. 20 tablet 5   polyethylene glycol powder (MIRALAX) 17 GM/SCOOP powder  17 g in 8 ounces water or juice once to twice daily (Patient taking differently: as needed. 17 g in 8 ounces water or juice once to twice daily) 578 g 11   Rimegepant Sulfate (NURTEC) 75 MG TBDP Take 75 mg by mouth daily as needed (take for abortive therapy of migraine, no more than 1 tablet in 24 hours or 10 per month). (Patient not taking: Reported on 07/02/2021) 8 tablet 11   sodium bicarbonate 650 MG tablet Take 650 mg by mouth 2 (two) times daily.     traZODone (DESYREL) 50 MG tablet 1 to 2 tabs by mouth at bedtime 60 tablet 11   No facility-administered medications prior to visit.     PAST MEDICAL HISTORY: Past Medical History:  Diagnosis Date   Anemia    Asthma    Since birth.   Chronic kidney  disease    Headache 1994   Migraine   Heart murmur    Hypertension age 39   Infertility, female    Pituitary adenoma ([place]) 2018   MR 2021 did not show lesion.   Prematurity 1983   delivered by C/S at 7.5 months gestation.  Ventilated for unknown period of time. Had left lung pneumothorax at 3 months with chest tube.   Seasonal allergies child   Uterine leiomyoma 2018   Removed 11/2016   Uterine polyp 2018     PAST SURGICAL HISTORY: Past Surgical History:  Procedure Laterality Date   excision uterine leiomyoma  11/2016   HYSTEROSCOPY  2018   removal of fibroids   LUNG SURGERY  1983   Premie and on ventilator--lung collapse.  Not clear if just a chest tube placed     FAMILY HISTORY: Family History  Problem Relation Age of Onset   Asthma Mother    Hypertension Mother    Thyroid disease Mother    Bipolar disorder Mother        Bipolar II   Schizophrenia Father    Bipolar disorder Sister    Hypertension Sister    Anxiety disorder Sister    Colon polyps Sister    Polycystic ovary syndrome Sister    Polycystic ovary syndrome Sister    Colon cancer Maternal Grandfather        26's?   Colon cancer Maternal Aunt 26     SOCIAL HISTORY: Social History   Socioeconomic  History   Marital status: Significant Other    Spouse name: Not on file   Number of children: 1   Years of education: Not on file   Highest education level: Associate degree: occupational, Hotel manager, or vocational program  Occupational History   Not on file  Tobacco Use   Smoking status: Former    Packs/day: 0.15    Types: Cigarettes   Smokeless tobacco: Never   Tobacco comments:    Would like to table for now.  Vaping Use   Vaping Use: Never used  Substance and Sexual Activity   Alcohol use: Yes    Comment: occasional wine with dinner   Drug use: No   Sexual activity: Yes  Other Topics Concern   Not on file  Social History Narrative   Lives at home with 40 yo adopted daughter.   Social Determinants of Health   Financial Resource Strain: Not on file  Food Insecurity: Not on file  Transportation Needs: Not on file  Physical Activity: Not on file  Stress: Not on file  Social Connections: Not on file  Intimate Partner Violence: Not on file      PHYSICAL EXAM  There were no vitals filed for this visit.  There is no height or weight on file to calculate BMI.   Generalized: Well developed, in no acute distress   Cardiology: Normal rate and rhythm, no murmur auscultated Respiratory: Clear to auscultation bilaterally  Neurological examination  Mentation: Alert oriented to time, place, history taking. Follows all commands speech and language fluent Cranial nerve II-XII: Pupils were equal round reactive to light. Extraocular movements were full, visual field were full . Motor: The motor testing reveals 5 over 5 strength of all 4 extremities. Good symmetric motor tone is noted throughout.  Gait and station: Gait is normal.     DIAGNOSTIC DATA (LABS, IMAGING, TESTING) - I reviewed patient records, labs, notes, testing and imaging myself where available.  Lab Results  Component Value Date  WBC 5.2 06/24/2020   HGB 14.0 06/24/2020   HCT 41.2 06/24/2020   MCV 93  06/24/2020   PLT 191 06/24/2020      Component Value Date/Time   NA 138 09/16/2020 0943   NA 137 06/24/2020 1346   K 4.6 09/16/2020 0943   CL 105 09/16/2020 0943   CO2 25 09/16/2020 0943   GLUCOSE 89 09/16/2020 0943   BUN 17 09/16/2020 0943   BUN 15 06/24/2020 1346   CREATININE 1.63 (H) 09/16/2020 0943   CALCIUM 9.2 09/16/2020 0943   PROT 7.9 06/24/2020 1346   ALBUMIN 4.7 06/24/2020 1346   AST 14 06/24/2020 1346   ALT 6 06/24/2020 1346   ALKPHOS 56 06/24/2020 1346   BILITOT 0.5 06/24/2020 1346   GFRNONAA 41 (L) 06/24/2020 1346   GFRAA 48 (L) 06/24/2020 1346   No results found for: CHOL, HDL, LDLCALC, LDLDIRECT, TRIG, CHOLHDL No results found for: HGBA1C No results found for: VITAMINB12 Lab Results  Component Value Date   TSH 1.33 09/16/2020      ASSESSMENT AND PLAN  41 y.o. year old female  has a past medical history of Anemia, Asthma, Chronic kidney disease, Headache (1994), Heart murmur, Hypertension (age 51), Infertility, female, Pituitary adenoma ([place]) (2018), Prematurity (1983), Seasonal allergies (child), Uterine leiomyoma (2018), and Uterine polyp (2018). here with    No diagnosis found.   Ms. Cisney feels Emglaity has helped reduce migraine days from 10/mth to 7/mth, however, she wishes to conceive. We have discussed safety of CGRP in pregnancy. Unfortunately the recommendation is to discontinue CGRP 6 months prior to conception. Data is limited on potential adverse effects. She agrees that it is best to discontinue Emgality at this time. She will continue Nurtec as needed and will notify me if she becomes pregnant. Will discuss other abortive options at that time. She is limited due to kidney disease. She may take ondansetron '4mg'$  as needed with Nurtec for nausea. Healthy lifestyle habits encouraged. She will follow-up with [place] in 1 year, sooner if needed. She verbalizes understanding and agreement with this plan.   Debbora Presto, MSN, FNP-C 11/24/2021, 11:27  AM  [facility] [address], [place], [place] [address] [phone]

## 2021-11-25 ENCOUNTER — Ambulatory Visit: Payer: Self-pay | Admitting: Family Medicine

## 2021-11-29 ENCOUNTER — Other Ambulatory Visit: Payer: Self-pay | Admitting: Internal Medicine

## 2021-11-29 DIAGNOSIS — J302 Other seasonal allergic rhinitis: Secondary | ICD-10-CM

## 2021-11-29 MED ORDER — CETIRIZINE HCL 10 MG PO TABS: 10.0000 mg | ORAL_TABLET | Freq: Every day | ORAL | 11 refills | Status: DC

## 2021-12-07 ENCOUNTER — Other Ambulatory Visit: Payer: Self-pay | Admitting: Internal Medicine

## 2021-12-07 DIAGNOSIS — G4700 Insomnia, unspecified: Secondary | ICD-10-CM

## 2021-12-07 DIAGNOSIS — F419 Anxiety disorder, unspecified: Secondary | ICD-10-CM

## 2021-12-07 DIAGNOSIS — F410 Panic disorder [episodic paroxysmal anxiety] without agoraphobia: Secondary | ICD-10-CM

## 2021-12-07 DIAGNOSIS — F32A Depression, unspecified: Secondary | ICD-10-CM

## 2021-12-07 NOTE — Progress Notes (Signed)
Here with her god daughter/daughter, Azell Der, on 11/29/2021 and requesting referral to [place] female counselor.   ?Connected with Helaine Chess from [employer] [facility] [employer] congregational SW program:  recommended Lorna Few at [facility]. ?Referral made ?

## 2021-12-30 ENCOUNTER — Ambulatory Visit (INDEPENDENT_AMBULATORY_CARE_PROVIDER_SITE_OTHER): Payer: BC Managed Care – PPO | Admitting: Internal Medicine

## 2021-12-30 ENCOUNTER — Encounter: Payer: Self-pay | Admitting: Internal Medicine

## 2021-12-30 VITALS — BP 118/86 | HR 68 | Resp 16 | Ht 62.0 in | Wt 162.0 lb

## 2021-12-30 DIAGNOSIS — I10 Essential (primary) hypertension: Secondary | ICD-10-CM

## 2021-12-30 DIAGNOSIS — N183 Chronic kidney disease, stage 3 unspecified: Secondary | ICD-10-CM

## 2021-12-30 DIAGNOSIS — R7989 Other specified abnormal findings of blood chemistry: Secondary | ICD-10-CM

## 2021-12-30 DIAGNOSIS — F419 Anxiety disorder, unspecified: Secondary | ICD-10-CM | POA: Diagnosis not present

## 2021-12-30 DIAGNOSIS — R809 Proteinuria, unspecified: Secondary | ICD-10-CM

## 2021-12-30 DIAGNOSIS — F410 Panic disorder [episodic paroxysmal anxiety] without agoraphobia: Secondary | ICD-10-CM

## 2021-12-30 DIAGNOSIS — D352 Benign neoplasm of pituitary gland: Secondary | ICD-10-CM

## 2021-12-30 DIAGNOSIS — F32A Depression, unspecified: Secondary | ICD-10-CM

## 2021-12-30 MED ORDER — LISINOPRIL 5 MG PO TABS: 5.0000 mg | ORAL_TABLET | Freq: Every day | ORAL | 11 refills | Status: DC

## 2021-12-30 MED ORDER — ALBUTEROL SULFATE HFA 108 (90 BASE) MCG/ACT IN AERS: INHALATION_SPRAY | RESPIRATORY_TRACT | 0 refills | Status: DC

## 2021-12-30 MED ORDER — ESCITALOPRAM OXALATE 20 MG PO TABS: ORAL_TABLET | ORAL | 11 refills | Status: DC

## 2021-12-30 NOTE — Progress Notes (Signed)
Subjective:    Patient ID: Tonya Wilson, female   DOB: 17-Nov-1980, 41 y.o.   MRN: [id number]   HPI   Hypertension:  She has elected not to pursue pregnancy any longer.  Her partner is planning to try instead.  They have been back together for past 2 years.  She would like to go back on a little Lisinopril to decrease her microalbuminuria.  2.  Pituitary microadenoma:  She was running out of cabergoline and could not get a refill from Dr. Rosario Adie office--states he is no longer here.  She tapered off the cabergoline over 1.5 months, coming completely off 2 months ago.  Would like to know if she can stop.  Did have 2 periods this month with 15 days in between, each lasting 4 days.  No definite increase in headaches or visual changes.  No nipple discharge.    3.  Panic disorder:  Using MJ Thursday through Sunday.  Smokes here and there through the long weekend as she is still struggling with crowds and stress throughout the week.  Gets shaky.  Taking Escitalpram 30 mg daily  Current Meds  Medication Sig   acetaminophen (TYLENOL) 500 MG tablet Take 1,000 mg by mouth every 6 (six) hours as needed. For pain   amLODipine (NORVASC) 5 MG tablet Take 1 tablet (5 mg total) by mouth daily.   cetirizine (ZYRTEC) 10 MG tablet Take 1 tablet (10 mg total) by mouth daily.   Cholecalciferol (VITAMIN D3) 50 MCG (2000 UT) TABS Take by mouth. 1 daily   clobetasol cream (TEMOVATE) 9.67 % Apply 1 application topically 2 (two) times daily.   cloNIDine (CATAPRES) 0.1 MG tablet TAKE 2 TABLETS BY MOUTH THREE TIMES DAILY   escitalopram (LEXAPRO) 20 MG tablet 1 1/2 tabs by mouth daily (Patient taking differently: 30 mg. 1 1/2 tabs by mouth daily)   ondansetron (ZOFRAN) 4 MG tablet Take 1 tablet (4 mg total) by mouth every 8 (eight) hours as needed for nausea or vomiting.   polyethylene glycol powder (MIRALAX) 17 GM/SCOOP powder 17 g in 8 ounces water or juice once to twice daily (Patient taking differently: as  needed. 17 g in 8 ounces water or juice once to twice daily)   Rimegepant Sulfate (NURTEC) 75 MG TBDP Take 75 mg by mouth daily as needed (take for abortive therapy of migraine, no more than 1 tablet in 24 hours or 10 per month).   sodium bicarbonate 650 MG tablet Take 650 mg by mouth 2 (two) times daily.   traZODone (DESYREL) 50 MG tablet 1 to 2 tabs by mouth at bedtime   Allergies  Allergen Reactions   Other Other (See Comments)    NO NSAIDS 2/2 CKD PER PT   Flagyl [Metronidazole] Rash     Review of Systems    Objective:   BP 118/86 (BP Location: Right Arm, Patient Position: Sitting, Cuff Size: Normal)   Pulse 68   Resp 16   Ht '5\' 2"'$  (1.575 m)   Wt 162 lb (73.5 kg)   LMP 12/20/2021 (Exact Date)   BMI 29.63 kg/m   Physical Exam NAD HEENT:  PERRL, EOMI, Discs sharp.  Visual fields full to confrontation. Neck:  Supple, No adenopathy Chest:  CTA CV:  RRR without murmur or rub.  Radial and DP pulses normal and equal.  No LE edema.    Assessment & Plan   Hypertension/CKD:  restart Lisinopril 5.  CMP and Urine microalbumin/crea.  BMP with BP check  in 1 week.  Follow up in 2 months.  2.  Depression/anxiety/panic disorder:  increase Escitalopram to 40 mg daily.  Call if problems.  Follow up in 2 months.  Encouraged edibles and not smoking.  3.  Pituitary microadenoma:  Prolactin level.

## 2021-12-31 LAB — COMPREHENSIVE METABOLIC PANEL
ALT: 13 IU/L (ref 0–32)
AST: 23 IU/L (ref 0–40)
Albumin/Globulin Ratio: 1.4 (ref 1.2–2.2)
Albumin: 4.5 g/dL (ref 3.8–4.8)
Alkaline Phosphatase: 54 IU/L (ref 44–121)
BUN/Creatinine Ratio: 7 — ABNORMAL LOW (ref 9–23)
BUN: 11 mg/dL (ref 6–20)
Bilirubin Total: 0.3 mg/dL (ref 0.0–1.2)
CO2: 19 mmol/L — ABNORMAL LOW (ref 20–29)
Calcium: 9.4 mg/dL (ref 8.7–10.2)
Chloride: 103 mmol/L (ref 96–106)
Creatinine, Ser: 1.47 mg/dL — ABNORMAL HIGH (ref 0.57–1.00)
Globulin, Total: 3.2 g/dL (ref 1.5–4.5)
Glucose: 79 mg/dL (ref 70–99)
Potassium: 4.4 mmol/L (ref 3.5–5.2)
Sodium: 141 mmol/L (ref 134–144)
Total Protein: 7.7 g/dL (ref 6.0–8.5)
eGFR: 46 mL/min/{1.73_m2} — ABNORMAL LOW (ref >59)

## 2021-12-31 LAB — PROLACTIN: Prolactin: 7.7 ng/mL (ref 4.8–23.3)

## 2021-12-31 LAB — MICROALBUMIN / CREATININE URINE RATIO
Creatinine, Urine: 102 mg/dL
Microalb/Creat Ratio: 251 mg/g creat — ABNORMAL HIGH (ref 0–29)
Microalbumin, Urine: 256.4 ug/mL

## 2022-01-07 ENCOUNTER — Other Ambulatory Visit: Payer: BC Managed Care – PPO | Admitting: Internal Medicine

## 2022-01-07 VITALS — BP 120/84 | HR 60

## 2022-01-07 DIAGNOSIS — Z79899 Other long term (current) drug therapy: Secondary | ICD-10-CM

## 2022-01-08 LAB — BASIC METABOLIC PANEL
BUN/Creatinine Ratio: 10 (ref 9–23)
BUN: 16 mg/dL (ref 6–20)
CO2: 20 mmol/L (ref 20–29)
Calcium: 8.7 mg/dL (ref 8.7–10.2)
Chloride: 107 mmol/L — ABNORMAL HIGH (ref 96–106)
Creatinine, Ser: 1.55 mg/dL — ABNORMAL HIGH (ref 0.57–1.00)
Glucose: 89 mg/dL (ref 70–99)
Potassium: 4.9 mmol/L (ref 3.5–5.2)
Sodium: 140 mmol/L (ref 134–144)
eGFR: 43 mL/min/{1.73_m2} — ABNORMAL LOW (ref >59)

## 2022-01-11 NOTE — Progress Notes (Signed)
After reporting bp to Dr Mulberry, no changes will be made to her medication. 

## 2022-01-12 ENCOUNTER — Other Ambulatory Visit: Payer: Self-pay

## 2022-01-12 MED ORDER — FLUTICASONE PROPIONATE 50 MCG/ACT NA SUSP: 2.0000 | Freq: Every day | NASAL | 6 refills | Status: DC

## 2022-01-12 MED ORDER — PREDNISONE 20 MG PO TABS
20.0000 mg | ORAL_TABLET | Freq: Every day | ORAL | 0 refills | Status: DC
Start: 2022-01-12 — End: 2022-03-17

## 2022-02-25 ENCOUNTER — Other Ambulatory Visit: Payer: Self-pay | Admitting: Internal Medicine

## 2022-02-25 ENCOUNTER — Other Ambulatory Visit: Payer: Self-pay

## 2022-02-25 DIAGNOSIS — I151 Hypertension secondary to other renal disorders: Secondary | ICD-10-CM

## 2022-03-07 ENCOUNTER — Other Ambulatory Visit: Payer: Self-pay | Admitting: Internal Medicine

## 2022-03-14 ENCOUNTER — Encounter: Payer: Self-pay | Admitting: Family Medicine

## 2022-03-17 ENCOUNTER — Ambulatory Visit: Payer: Self-pay | Admitting: Family Medicine

## 2022-03-17 ENCOUNTER — Encounter: Payer: Self-pay | Admitting: Internal Medicine

## 2022-03-17 ENCOUNTER — Ambulatory Visit (INDEPENDENT_AMBULATORY_CARE_PROVIDER_SITE_OTHER): Payer: BC Managed Care – PPO | Admitting: Internal Medicine

## 2022-03-17 VITALS — BP 110/84 | HR 64 | Resp 16 | Ht 62.0 in | Wt 167.0 lb

## 2022-03-17 DIAGNOSIS — F419 Anxiety disorder, unspecified: Secondary | ICD-10-CM

## 2022-03-17 DIAGNOSIS — D352 Benign neoplasm of pituitary gland: Secondary | ICD-10-CM | POA: Diagnosis not present

## 2022-03-17 DIAGNOSIS — F410 Panic disorder [episodic paroxysmal anxiety] without agoraphobia: Secondary | ICD-10-CM | POA: Diagnosis not present

## 2022-03-17 DIAGNOSIS — N183 Chronic kidney disease, stage 3 unspecified: Secondary | ICD-10-CM

## 2022-03-17 DIAGNOSIS — F32A Depression, unspecified: Secondary | ICD-10-CM

## 2022-03-17 DIAGNOSIS — I10 Essential (primary) hypertension: Secondary | ICD-10-CM

## 2022-03-17 MED ORDER — CLONAZEPAM 1 MG PO TABS: ORAL_TABLET | ORAL | 0 refills | Status: DC

## 2022-03-17 NOTE — Progress Notes (Signed)
    Subjective:    Patient ID: Tonya Wilson, female   DOB: March 20, 1981, 41 y.o.   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  Prolactin in June was 7.7, normal range.    2.  Migraines:  No migraines for 1 year since changing jobs. Has not used Nurtec since 06/2021.    3.  Anxiety/panic disorder:  was very upset and crying after a phone call with sister.  Have not obtained vitals as was so upset.  Spoke with our LCSW-A, Gala Romney and calmed.  They are planning to schedule regular visits as she has not heard from counselor at [facility] and cannot get in after we called today until end of September.  Current Meds  Medication Sig   acetaminophen (TYLENOL) 500 MG tablet Take 1,000 mg by mouth every 6 (six) hours as needed. For pain   albuterol (VENTOLIN HFA) 108 (90 Base) MCG/ACT inhaler 2 puffs every 6 hours as needed for wheezing   amLODipine (NORVASC) 5 MG tablet TAKE 1 TABLET(5 MG) BY MOUTH DAILY   cetirizine (ZYRTEC) 10 MG tablet Take 1 tablet (10 mg total) by mouth daily.   clobetasol cream (TEMOVATE) 2.63 % Apply 1 application topically 2 (two) times daily.   cloNIDine (CATAPRES) 0.1 MG tablet TAKE 2 TABLETS BY MOUTH THREE TIMES DAILY   escitalopram (LEXAPRO) 20 MG tablet 2 tabs by mouth daily   lisinopril (ZESTRIL) 5 MG tablet Take 1 tablet (5 mg total) by mouth daily.   ondansetron (ZOFRAN) 4 MG tablet Take 1 tablet (4 mg total) by mouth every 8 (eight) hours as needed for nausea or vomiting.   polyethylene glycol powder (MIRALAX) 17 GM/SCOOP powder 17 g in 8 ounces water or juice once to twice daily (Patient taking differently: as needed. 17 g in 8 ounces water or juice once to twice daily)   sodium bicarbonate 650 MG tablet Take 650 mg by mouth 2 (two) times daily.   traZODone (DESYREL) 50 MG tablet 1 to 2 tabs by mouth at bedtime    Allergies  Allergen Reactions   Other Other (See Comments)    NO  NSAIDS 2/2 CKD PER PT   Flagyl [Metronidazole] Rash     Review of Systems    Objective:   There were no vitals taken for this visit. Could not get vitals to print here:  BP:  110/84 once calm.  P 64  RR:  16  Physical Exam Crying and very upset when first in room after patient pacing in parking lot for 20 min on phone call to family member.   Lungs:  CTA CV:  RRR without murmur or rub.  Radial pulses normal and equal.    Assessment & Plan    History of pituitary adenoma:  June testing with prolactin in normal range, though increased from when taking dostinex in December.  Repeat prolactin in about 2 months planned.  2.  Anxiety and Depression:  not doing well today.  Short course of as need Clonazepam.  F/U 3 months.    3.  CKD:  hypertension: Crea fairly stable in June when last tested.  BP fine once calm.

## 2022-03-24 ENCOUNTER — Telehealth: Payer: Self-pay

## 2022-03-24 DIAGNOSIS — T464X5A Adverse effect of angiotensin-converting-enzyme inhibitors, initial encounter: Secondary | ICD-10-CM

## 2022-03-24 MED ORDER — LOSARTAN POTASSIUM 25 MG PO TABS: ORAL_TABLET | ORAL | 11 refills | Status: DC

## 2022-03-24 NOTE — Telephone Encounter (Signed)
Patient called to report that she is no longer taking lisinopril. Realized that she was getting a cough when taking it. Cough stopped immediately after stopping the medication. Currently taking amlodipine once daily and clonidine three times daily.

## 2022-03-25 NOTE — Telephone Encounter (Signed)
Patient notified of medication. Patient will call back when she is able to schedule appt.

## 2022-03-25 NOTE — Telephone Encounter (Signed)
Voicemail left asking to call back

## 2022-04-06 ENCOUNTER — Other Ambulatory Visit: Payer: BC Managed Care – PPO | Admitting: Internal Medicine

## 2022-04-20 ENCOUNTER — Other Ambulatory Visit: Payer: BC Managed Care – PPO | Admitting: Internal Medicine

## 2022-05-04 ENCOUNTER — Other Ambulatory Visit: Payer: BC Managed Care – PPO | Admitting: Internal Medicine

## 2022-05-11 ENCOUNTER — Other Ambulatory Visit: Payer: BC Managed Care – PPO | Admitting: Internal Medicine

## 2022-05-16 ENCOUNTER — Other Ambulatory Visit (INDEPENDENT_AMBULATORY_CARE_PROVIDER_SITE_OTHER): Payer: BC Managed Care – PPO

## 2022-05-16 DIAGNOSIS — R7989 Other specified abnormal findings of blood chemistry: Secondary | ICD-10-CM

## 2022-05-16 DIAGNOSIS — E559 Vitamin D deficiency, unspecified: Secondary | ICD-10-CM | POA: Diagnosis not present

## 2022-05-17 LAB — PROLACTIN: Prolactin: 130 ng/mL — ABNORMAL HIGH (ref 4.8–23.3)

## 2022-05-17 LAB — VITAMIN D 25 HYDROXY (VIT D DEFICIENCY, FRACTURES): Vit D, 25-Hydroxy: 23.7 ng/mL — ABNORMAL LOW (ref 30.0–100.0)

## 2022-05-18 ENCOUNTER — Other Ambulatory Visit: Payer: Self-pay | Admitting: Internal Medicine

## 2022-05-20 MED ORDER — VITAMIN D3 25 MCG (1000 UT) PO CAPS: 1000.0000 [IU] | ORAL_CAPSULE | Freq: Every day | ORAL | 0 refills | Status: AC

## 2022-05-20 NOTE — Addendum Note (Signed)
Addended by: Marcelino Duster on: 05/20/2022 04:36 PM   Modules accepted: Orders

## 2022-05-31 ENCOUNTER — Other Ambulatory Visit: Payer: BC Managed Care – PPO

## 2022-06-01 ENCOUNTER — Other Ambulatory Visit: Payer: BC Managed Care – PPO | Admitting: Internal Medicine

## 2022-06-10 ENCOUNTER — Other Ambulatory Visit: Payer: Self-pay

## 2022-06-10 MED ORDER — CABERGOLINE 0.5 MG PO TABS: 0.5000 mg | ORAL_TABLET | ORAL | 3 refills | Status: DC

## 2022-06-29 ENCOUNTER — Ambulatory Visit: Payer: Self-pay | Admitting: Internal Medicine

## 2022-06-29 DIAGNOSIS — F411 Generalized anxiety disorder: Secondary | ICD-10-CM

## 2022-06-29 DIAGNOSIS — F410 Panic disorder [episodic paroxysmal anxiety] without agoraphobia: Secondary | ICD-10-CM | POA: Diagnosis not present

## 2022-07-06 ENCOUNTER — Ambulatory Visit (INDEPENDENT_AMBULATORY_CARE_PROVIDER_SITE_OTHER): Payer: Self-pay | Admitting: Internal Medicine

## 2022-07-06 DIAGNOSIS — F411 Generalized anxiety disorder: Secondary | ICD-10-CM | POA: Diagnosis not present

## 2022-07-06 DIAGNOSIS — F410 Panic disorder [episodic paroxysmal anxiety] without agoraphobia: Secondary | ICD-10-CM

## 2022-07-28 ENCOUNTER — Encounter: Payer: Self-pay | Admitting: Internal Medicine

## 2022-07-28 ENCOUNTER — Ambulatory Visit (INDEPENDENT_AMBULATORY_CARE_PROVIDER_SITE_OTHER): Payer: No Typology Code available for payment source | Admitting: Internal Medicine

## 2022-07-28 VITALS — BP 132/88 | HR 80 | Resp 16 | Ht 62.0 in | Wt 154.0 lb

## 2022-07-28 DIAGNOSIS — D352 Benign neoplasm of pituitary gland: Secondary | ICD-10-CM | POA: Diagnosis not present

## 2022-07-28 DIAGNOSIS — F32A Depression, unspecified: Secondary | ICD-10-CM

## 2022-07-28 DIAGNOSIS — F419 Anxiety disorder, unspecified: Secondary | ICD-10-CM

## 2022-07-28 DIAGNOSIS — I10 Essential (primary) hypertension: Secondary | ICD-10-CM

## 2022-07-28 MED ORDER — CLONAZEPAM 1 MG PO TABS: ORAL_TABLET | ORAL | 0 refills | Status: DC

## 2022-07-28 MED ORDER — DULOXETINE HCL 60 MG PO CPEP: 60.0000 mg | ORAL_CAPSULE | Freq: Every day | ORAL | 3 refills | Status: DC

## 2022-07-28 NOTE — Progress Notes (Addendum)
Subjective:    Patient ID: Buck Mam, female   DOB: Oct 22, 1980, 42 y.o.   MRN: [id number]   HPI   Pituitary adenoma with recurrent increase of prolactin after off Dostinex for many months.  She did not get Dostinex filled in November as cost was $200 and thought it was due to needing an 90 day Rx, which was what was sent in.  Phone call into [facility] to clarify and get set up with new insurance, [employer].  Has been having more headaches in past 2 months as did before with adenoma years ago.  2.  Anxiety/panic disorder:  Feels Escitalopram is working for depression--not having the sadness.  No suicidal ideation.   Has blocked all contact with her daughter, sister, and mom.   Always anxious and also having panic attacks. Doesn't like to be out--mainly when with other people.   Has moved to a home and is basically very alone right now.   Has taken Prozac, Lexapro, Zoloft in past without improvement  Current Meds  Medication Sig   acetaminophen (TYLENOL) 500 MG tablet Take 1,000 mg by mouth every 6 (six) hours as needed. For pain   albuterol (VENTOLIN HFA) 108 (90 Base) MCG/ACT inhaler 2 puffs every 6 hours as needed for wheezing   amLODipine (NORVASC) 5 MG tablet TAKE 1 TABLET(5 MG) BY MOUTH DAILY   cetirizine (ZYRTEC) 10 MG tablet Take 1 tablet (10 mg total) by mouth daily.   Cholecalciferol (VITAMIN D3) 25 MCG (1000 UT) CAPS Take 1 capsule (1,000 Units total) by mouth daily.   clobetasol cream (TEMOVATE) 6.83 % Apply 1 application topically 2 (two) times daily.   cloNIDine (CATAPRES) 0.1 MG tablet TAKE 2 TABLETS BY MOUTH THREE TIMES DAILY   escitalopram (LEXAPRO) 20 MG tablet 2 tabs by mouth daily   losartan (COZAAR) 25 MG tablet 1 tab by mouth daily   ondansetron (ZOFRAN) 4 MG tablet Take 1 tablet (4 mg total) by mouth every 8 (eight) hours as needed for nausea or vomiting.   polyethylene glycol powder (MIRALAX) 17 GM/SCOOP powder 17 g in 8 ounces water or juice once  to twice daily (Patient taking differently: as needed. 17 g in 8 ounces water or juice once to twice daily)   sodium bicarbonate 650 MG tablet Take 650 mg by mouth 2 (two) times daily.   traZODone (DESYREL) 50 MG tablet 1 to 2 tabs BY MOUTH AT BEDTIME   Allergies  Allergen Reactions   Other Other (See Comments)    NO NSAIDS 2/2 CKD PER PT   Flagyl [Metronidazole] Rash     Review of Systems    Objective:   BP 132/88 (BP Location: Left Arm, Patient Position: Sitting, Cuff Size: Normal)   Pulse 80   Resp 16   Ht '5\' 2"'$  (1.575 m)   Wt 154 lb (69.9 kg)   BMI 28.17 kg/m   Physical Exam Tearful Leg shaking throughout.   Lungs:  CTA CV:  RRR without murmur or rub.  Radial pulses normal and equal.  Assessment & Plan   Pituitary microadenoma:  Jaymie at [facility] will call back once she has more info regarding coverage of Dostinex with new insurance.  2.  Depression/anxiety/panic disorder/possibly developing agoraphobia:  Switch to Duloxetine/Cymbalta at 60 mg daily.  Rx for #60 of Clonazepam 1 mg to take 1/2 to 1 tab twice daily --recommended perhaps taking twice daily for a couple of days to calm things down and then only as  needed thereafter.   To keep track of dosing of Clonazepam. If after 6 weeks, still requiring frequent Clonazepam, consider of addition of buspar or gabapentin to augment for anxiety. Follow up in 1-2 weeks. Continue counseling with Gala Romney, LCSW-A and will conference plan with her.  3. Hypertension:  controlled.    4.  HM:  refused vaccines.

## 2022-07-29 ENCOUNTER — Ambulatory Visit: Payer: No Typology Code available for payment source | Admitting: Internal Medicine

## 2022-08-03 ENCOUNTER — Other Ambulatory Visit: Payer: No Typology Code available for payment source | Admitting: Psychology

## 2022-08-17 ENCOUNTER — Other Ambulatory Visit: Payer: No Typology Code available for payment source | Admitting: Psychology

## 2022-08-18 ENCOUNTER — Ambulatory Visit (INDEPENDENT_AMBULATORY_CARE_PROVIDER_SITE_OTHER): Payer: No Typology Code available for payment source | Admitting: Internal Medicine

## 2022-08-18 ENCOUNTER — Encounter: Payer: Self-pay | Admitting: Internal Medicine

## 2022-08-18 ENCOUNTER — Ambulatory Visit (INDEPENDENT_AMBULATORY_CARE_PROVIDER_SITE_OTHER): Payer: No Typology Code available for payment source | Admitting: Psychology

## 2022-08-18 VITALS — BP 120/86 | HR 76 | Resp 12 | Ht 62.0 in | Wt 155.0 lb

## 2022-08-18 DIAGNOSIS — F32A Depression, unspecified: Secondary | ICD-10-CM | POA: Diagnosis not present

## 2022-08-18 DIAGNOSIS — D352 Benign neoplasm of pituitary gland: Secondary | ICD-10-CM | POA: Diagnosis not present

## 2022-08-18 DIAGNOSIS — F419 Anxiety disorder, unspecified: Secondary | ICD-10-CM

## 2022-08-18 NOTE — Progress Notes (Signed)
    Subjective:    Patient ID: Tonya Wilson, female   DOB: June 07, 1981, 42 y.o.   MRN: [id number]   HPI   Depression and Anxiety;  Slept well the first weekend after med change to Cymbalta/Duloxetine.  Took Clonazepam twice daily during that time.  The following Tuesday, the 9th, she felt much better and really has not needed the Clonazepam more than twice and 1/2 dose at that.  Anxiety is significantly decreased.   Has reconnected with her mom.   Boundaries set.   Has unblocked her daughter.  She is texting a little bit with her  Current Meds  Medication Sig   acetaminophen (TYLENOL) 500 MG tablet Take 1,000 mg by mouth every 6 (six) hours as needed. For pain   albuterol (VENTOLIN HFA) 108 (90 Base) MCG/ACT inhaler 2 puffs every 6 hours as needed for wheezing   amLODipine (NORVASC) 5 MG tablet TAKE 1 TABLET(5 MG) BY MOUTH DAILY   cabergoline (DOSTINEX) 0.5 MG tablet Take 1 tablet (0.5 mg total) by mouth 2 (two) times a week.   cetirizine (ZYRTEC) 10 MG tablet Take 1 tablet (10 mg total) by mouth daily.   Cholecalciferol (VITAMIN D3) 25 MCG (1000 UT) CAPS Take 1 capsule (1,000 Units total) by mouth daily.   clobetasol cream (TEMOVATE) 0.97 % Apply 1 application topically 2 (two) times daily.   clonazePAM (KLONOPIN) 1 MG tablet 1/2 to 1 tab by mouth twice daily only as needed for anxiety   cloNIDine (CATAPRES) 0.1 MG tablet TAKE 2 TABLETS BY MOUTH THREE TIMES DAILY   DULoxetine (CYMBALTA) 60 MG capsule Take 1 capsule (60 mg total) by mouth daily.   losartan (COZAAR) 25 MG tablet 1 tab by mouth daily   ondansetron (ZOFRAN) 4 MG tablet Take 1 tablet (4 mg total) by mouth every 8 (eight) hours as needed for nausea or vomiting.   polyethylene glycol powder (MIRALAX) 17 GM/SCOOP powder 17 g in 8 ounces water or juice once to twice daily (Patient taking differently: as needed. 17 g in 8 ounces water or juice once to twice daily)   sodium bicarbonate 650 MG tablet Take 650 mg by mouth 2 (two)  times daily.   traZODone (DESYREL) 50 MG tablet 1 to 2 tabs BY MOUTH AT BEDTIME   Allergies  Allergen Reactions   Other Other (See Comments)    NO NSAIDS 2/2 CKD PER PT   Flagyl [Metronidazole] Rash     Review of Systems    Objective:   BP 120/86 (BP Location: Left Arm, Patient Position: Sitting, Cuff Size: Normal)   Pulse 76   Resp 12   Ht '5\' 2"'$  (1.575 m)   Wt 155 lb (70.3 kg)   LMP 08/05/2022 (Exact Date)   BMI 28.35 kg/m   Physical Exam Appears calm and well rested. Neatly dressed  Assessment & Plan    Anxiety/depression:  improved greatly with switch to Duloxetine.  Have discussed no further refills of Clonazepam .  2.  Pituitary adenoma with elevated PTH:  Apparent hold up with cabergoline initially, but she is taking regularly again now.

## 2022-08-22 NOTE — Progress Notes (Signed)
SOAP Notes Session Summary Provider / Clinician's Name: Stephenie Acres Darrow Barreiro  Client Name: Tonya Wilson  Date of Service: 08/18/2022 Duration: 60  Subjective: Client stated that, "for the most part she was doing good." Client stated, "that the move was for the best and that she is continuing to enjoy her new home. Client stated, "that she was worried about finances and that she was losing her job." She said that, "she had to let go of the stress because she could not control it anyway." Client is applying to another position within the company that she said she was guaranteed to get but it was making less money. However, this would allow her time to find a new job. She said, "she thought the new combination of medication she was taking was working and that she was remaining calmer." She also stated, "that she was continuing to keep space from people that were causing negativity in her life." However, she did say that she unblocked her mother and her daughter and that she was slowing letting them back in to her life.  Objective: I think the client is doing pretty well overall. Especially since her life is having some unexpected twists and turns and she seems to be going with the flow. I am still so glad that she has her kitten. He brings her so much joy and support that she did not even know that she needed. I am proud that she let her mom and sister back into her life and that she is sticking to her boundaries when it comes to the two of them.   Assessment: The client is responding very well to the treatment. She feels confident in the place that she is in and it is very noticeable. She still has moments when she feels like she is frustrated and needs a break from other people but she is working through those feelings. She is able to control her anger and maintain her sobriety for several years now and continues to want to grow. 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 She is continuing her growth and development through her daily actions continuing to achieve her emotional stability.  Plan: Continued bi-weekly therapy sessions.Continue setting boundaries for growth.

## 2022-09-07 ENCOUNTER — Other Ambulatory Visit: Payer: No Typology Code available for payment source

## 2022-09-28 ENCOUNTER — Telehealth: Payer: Self-pay

## 2022-09-28 NOTE — Telephone Encounter (Signed)
Patient called to report that for the past few month she has been experiencing dizzy spells that make her pass out. Usually happens during ovulation. Also reports that during ovulation her sciatica flares up and makes it difficult to get out of bed.

## 2022-10-03 NOTE — Telephone Encounter (Signed)
Attempted to call to offer appointment but unable to reach patient

## 2022-10-03 NOTE — Progress Notes (Signed)
SOAP Notes Session Summary Provider / Clinician's Name: Stephenie Acres Reiley Bertagnolli  Client Name: Tonya Wilson  Date of Service: 06/29/2022 Duration: 60 minutes  Subjective: Client states, "that she is having panic attacks again." She said, "there is no telling when they will happen but she can pinpoint it to times where the stress is running high." Specifically, surrounding money, family relationships, and previous romantic relationships. 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 Amil Amen are working on adjusting her medication and she was excited about that in hopes that the new medication would provided further relief." She also stated, "that she was putting very firm boundaries down with her daughter because she knew that this was a big point of contention and wanted to remove that stress."  Objective: Rebbie was having a very hard time today. She was very tearful and almost feeling hopeless. She is aware of these feelings that pop up but when they seem to be stacking up against her she has a hard time moving past whatever it was that got her upset to begin with and this leads to panic attacks. I told her I was also worried about her driving for [employer] with all that she has going on. We talked about a plan for that. We also talked about healthy boundaries between her and her daughter. I do support this right now because the daughter is pushing some boundaries and right now Doniesha's mental health needs to be prioritized.    Assessment: Jesusita responds well to therapy. She puts in the work and is very self aware even when things are stacking up against her. She knows when she needs to put a boundary in place and she might need some time to work through it but she knows how to do that. I think the  changes in medicines will be good for her as she needs something that will really target the anxiety piece. I think this in turn will help with the panic attacks that are happening.  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 I think this could be a good distraction and we all feel better when we are more financially secure, I just want her to make sure that she feels as good as she can before she turns on the app. If she is feeling tension, stress, or tearful she will not turn on the app that day. Continued bi-weekly therapy and health boundaries in place for loved ones right now.

## 2022-10-03 NOTE — Progress Notes (Signed)
SOAP Notes Session Summary Provider / Clinician's Name: Stephenie Acres Kahne Helfand  Client Name: Tonya Wilson  Date of Service: 07/06/22 Duration: 60 minutes   Subjective: Client stated, "that she is doing better." She said that she is still going through a lot but that she is taking it one day at a time and trying to breath through it. She stated, "that having the clear cut boundaries are helping with the relationships in her life. 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 She seems a little more content and she seems to be less tearful. She seems to be trying to focus on the positive side of things and trying to change her way of thinking when the tough times occur. I was surprised at how positive the [employer] job was going but I think that it has become a way to socialize without a commitment and a healthy distraction that is also helping with the finances so it is a win.   Assessment: I am thankful that the client is keeping up with bi-weekly therapy sessions. I think the check in and accountability surrounding that has been good for the client. I reminded client to give herself some grace and to remember that she is putting in the work and that is why she is feeling a bit better and that she will hit bumps in the road but she knows what to do when that happens. All while taking one day at a time.  Plan: Continue bi-weekly sessions.

## 2022-10-11 NOTE — Addendum Note (Signed)
Addended by: Frederich Chick on: 10/11/2022 04:45 PM   Modules accepted: Level of Service

## 2022-10-18 NOTE — Telephone Encounter (Signed)
Attempted to call patient, unable to reach her

## 2022-11-15 ENCOUNTER — Ambulatory Visit: Payer: No Typology Code available for payment source | Admitting: Internal Medicine

## 2022-11-15 ENCOUNTER — Encounter: Payer: Self-pay | Admitting: Internal Medicine

## 2022-11-15 VITALS — BP 128/86 | HR 72 | Resp 12 | Ht 62.0 in | Wt 155.0 lb

## 2022-11-15 DIAGNOSIS — D352 Benign neoplasm of pituitary gland: Secondary | ICD-10-CM | POA: Diagnosis not present

## 2022-11-15 DIAGNOSIS — N183 Chronic kidney disease, stage 3 unspecified: Secondary | ICD-10-CM

## 2022-11-15 DIAGNOSIS — F32A Depression, unspecified: Secondary | ICD-10-CM

## 2022-11-15 DIAGNOSIS — R809 Proteinuria, unspecified: Secondary | ICD-10-CM

## 2022-11-15 DIAGNOSIS — F410 Panic disorder [episodic paroxysmal anxiety] without agoraphobia: Secondary | ICD-10-CM

## 2022-11-15 DIAGNOSIS — F419 Anxiety disorder, unspecified: Secondary | ICD-10-CM

## 2022-11-15 DIAGNOSIS — I10 Essential (primary) hypertension: Secondary | ICD-10-CM | POA: Diagnosis not present

## 2022-11-15 DIAGNOSIS — E559 Vitamin D deficiency, unspecified: Secondary | ICD-10-CM

## 2022-11-15 MED ORDER — DULOXETINE HCL 60 MG PO CPEP: 60.0000 mg | ORAL_CAPSULE | Freq: Every day | ORAL | 3 refills | Status: DC

## 2022-11-15 MED ORDER — NURTEC 75 MG PO TBDP: 75.0000 mg | ORAL_TABLET | Freq: Every day | ORAL | 11 refills | Status: DC | PRN

## 2022-11-15 NOTE — Progress Notes (Signed)
Subjective:    Patient ID: Tonya Wilson, female   DOB: April 17, 1981, 42 y.o.   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   So much work for so little money.  States she is good at the job, but struggling to put food in her fridge and paycheck to paycheck with bills. Wants to be happier professionally.   With stress, having migraines again--generally only during her period.  Cannot afford to go back to [facility] to get a refill of Nurtec, which worked really well for her.    2.  Depression and Panic Disorder:  Feels this is under control with her current mix of meds.  Only using Trazodone intermittently.  She does continue to have some anxiety with driving, but has a kitten to calm her when not doing [place].   Has not been in to see [facility] P[facility], [place], in some time, but planning to continue to counsel with her.  3.  Pituitary adenoma with elevated prolactin.  Restarted Cabergoline in November or December.  Then insurance did not cover and was off until perhaps end of February.  No nipple discharge.  Periods are regular and normal.    4.  Hypertension:  controlled  5.  CKD:  has not had labs in recent months.  6.  Vitamin D deficiency:  has not been back for recheck of level.  States taking Vitamin D3 regularly.  Current Meds  Medication Sig   acetaminophen (TYLENOL) 500 MG tablet Take 1,000 mg by mouth every 6 (six) hours as needed. For pain   albuterol (VENTOLIN HFA) 108 (90 Base) MCG/ACT inhaler 2 puffs every 6 hours as needed for wheezing   amLODipine (NORVASC) 5 MG tablet TAKE 1 TABLET(5 MG) BY MOUTH DAILY   cabergoline (DOSTINEX) 0.5 MG tablet Take 1 tablet (0.5 mg total) by mouth 2 (two) times a week.   cetirizine (ZYRTEC) 10 MG tablet Take 1 tablet (10 mg total) by mouth daily.   Cholecalciferol (VITAMIN D3) 25 MCG (1000 UT) CAPS Take 1 capsule (1,000 Units total) by mouth  daily.   clobetasol cream (TEMOVATE) 0.05 % Apply 1 application topically 2 (two) times daily.   clonazePAM (KLONOPIN) 1 MG tablet 1/2 to 1 tab by mouth twice daily only as needed for anxiety   cloNIDine (CATAPRES) 0.1 MG tablet TAKE 2 TABLETS BY MOUTH THREE TIMES DAILY   DULoxetine (CYMBALTA) 60 MG capsule Take 1 capsule (60 mg total) by mouth daily.   losartan (COZAAR) 25 MG tablet 1 tab by mouth daily   ondansetron (ZOFRAN) 4 MG tablet Take 1 tablet (4 mg total) by mouth every 8 (eight) hours as needed for nausea or vomiting.   polyethylene glycol powder (MIRALAX) 17 GM/SCOOP powder 17 g in 8 ounces water or juice once to twice daily (Patient taking differently: as needed. 17 g in 8 ounces water or juice once to twice daily)   traZODone (DESYREL) 50 MG tablet 1 to 2 tabs BY MOUTH AT BEDTIME (Patient taking differently: as needed. 1 to 2 tabs BY MOUTH AT BEDTIME)   Allergies  Allergen Reactions   Other Other (See Comments)    NO NSAIDS 2/2 CKD PER PT   Flagyl [Metronidazole] Rash     Review of Systems    Objective:   BP 128/86 (BP Location: Right Arm, Patient Position: Sitting,  Cuff Size: Normal)   Pulse 72   Resp 12   Ht 5\' 2"  (1.575 m)   Wt 155 lb (70.3 kg)   LMP 11/04/2022 (Exact Date)   BMI 28.35 kg/m   Physical Exam NAD HEENT:  PERRL, EOMI, TMs pearly gray, throat without injection. Neck:  supple, No adenopathy Chest:  CTA CV:  RRR without murmur or rub.  Radial pulses normal and equal.   Abd:  S, NT, No HSM or mass, + BS Neuro A & O x 3, CN II-XII grossly intact.  Visual fields full to confrontation.  Motor 5/5 and DTRs 2+/4 throughout.  Gait normal.   Assessment & Plan   Migraines:  refilled  NUrtec for her.  2.  Depression/anxiety:  encouraged her to follow up with Morene Antu, LCSWA to work toward making goals.  Continue current meds.  3.  Pituitary adenoma:  check prolactin level.    4.  CKD:  urine microalbumin/crea and CmP, CBC  5.   Hypertension:  would like to see diastolic a bit lower.  6.  Low Vitamin D:  check level and continue D3 for now.

## 2022-11-16 LAB — COMPREHENSIVE METABOLIC PANEL
Albumin/Globulin Ratio: 1.4 (ref 1.2–2.2)
Bilirubin Total: 0.3 mg/dL (ref 0.0–1.2)
Calcium: 9.2 mg/dL (ref 8.7–10.2)

## 2022-11-16 LAB — CBC WITH DIFFERENTIAL/PLATELET
EOS (ABSOLUTE): 0.1 10*3/uL (ref 0.0–0.4)
Hemoglobin: 11.7 g/dL (ref 11.1–15.9)
MCH: 31.5 pg (ref 26.6–33.0)
MCV: 94 fL (ref 79–97)
Neutrophils: 61 %
Platelets: 279 10*3/uL (ref 150–450)
RDW: 13 % (ref 11.7–15.4)
WBC: 6.5 10*3/uL (ref 3.4–10.8)

## 2022-11-16 LAB — MICROALBUMIN / CREATININE URINE RATIO

## 2022-11-16 NOTE — Telephone Encounter (Signed)
Patient has been seen.

## 2022-11-17 LAB — COMPREHENSIVE METABOLIC PANEL
ALT: 8 IU/L (ref 0–32)
AST: 17 IU/L (ref 0–40)
Albumin: 4.2 g/dL (ref 3.9–4.9)
Alkaline Phosphatase: 59 IU/L (ref 44–121)
BUN/Creatinine Ratio: 14 (ref 9–23)
BUN: 20 mg/dL (ref 6–24)
CO2: 17 mmol/L — ABNORMAL LOW (ref 20–29)
Chloride: 106 mmol/L (ref 96–106)
Creatinine, Ser: 1.47 mg/dL — ABNORMAL HIGH (ref 0.57–1.00)
Globulin, Total: 3 g/dL (ref 1.5–4.5)
Glucose: 67 mg/dL — ABNORMAL LOW (ref 70–99)
Potassium: 4.4 mmol/L (ref 3.5–5.2)
Sodium: 141 mmol/L (ref 134–144)
Total Protein: 7.2 g/dL (ref 6.0–8.5)
eGFR: 46 mL/min/{1.73_m2} — ABNORMAL LOW (ref >59)

## 2022-11-17 LAB — CBC WITH DIFFERENTIAL/PLATELET
Basophils Absolute: 0 10*3/uL (ref 0.0–0.2)
Basos: 0 %
Eos: 1 %
Hematocrit: 35 % (ref 34.0–46.6)
Immature Grans (Abs): 0 10*3/uL (ref 0.0–0.1)
Immature Granulocytes: 0 %
Lymphocytes Absolute: 2 10*3/uL (ref 0.7–3.1)
Lymphs: 31 %
MCHC: 33.4 g/dL (ref 31.5–35.7)
Monocytes Absolute: 0.4 10*3/uL (ref 0.1–0.9)
Monocytes: 7 %
Neutrophils Absolute: 3.9 10*3/uL (ref 1.4–7.0)
RBC: 3.71 x10E6/uL — ABNORMAL LOW (ref 3.77–5.28)

## 2022-11-17 LAB — LIPID PANEL W/O CHOL/HDL RATIO
Cholesterol, Total: 223 mg/dL — ABNORMAL HIGH (ref 100–199)
HDL: 97 mg/dL (ref >39)
LDL Chol Calc (NIH): 114 mg/dL — ABNORMAL HIGH (ref 0–99)
Triglycerides: 67 mg/dL (ref 0–149)
VLDL Cholesterol Cal: 12 mg/dL (ref 5–40)

## 2022-11-17 LAB — VITAMIN D 25 HYDROXY (VIT D DEFICIENCY, FRACTURES): Vit D, 25-Hydroxy: 28.9 ng/mL — ABNORMAL LOW (ref 30.0–100.0)

## 2022-11-17 LAB — MICROALBUMIN / CREATININE URINE RATIO
Creatinine, Urine: 67.3 mg/dL
Microalb/Creat Ratio: 333 mg/g creat — ABNORMAL HIGH (ref 0–29)

## 2022-11-17 LAB — PROLACTIN: Prolactin: 5.3 ng/mL (ref 4.8–33.4)

## 2022-12-14 ENCOUNTER — Other Ambulatory Visit: Payer: No Typology Code available for payment source | Admitting: Psychology

## 2022-12-21 ENCOUNTER — Ambulatory Visit: Payer: No Typology Code available for payment source | Admitting: Psychology

## 2022-12-21 DIAGNOSIS — F419 Anxiety disorder, unspecified: Secondary | ICD-10-CM | POA: Diagnosis not present

## 2022-12-21 DIAGNOSIS — F32A Depression, unspecified: Secondary | ICD-10-CM

## 2022-12-21 DIAGNOSIS — F410 Panic disorder [episodic paroxysmal anxiety] without agoraphobia: Secondary | ICD-10-CM

## 2023-01-01 ENCOUNTER — Other Ambulatory Visit: Payer: Self-pay | Admitting: Internal Medicine

## 2023-01-01 DIAGNOSIS — E559 Vitamin D deficiency, unspecified: Secondary | ICD-10-CM | POA: Insufficient documentation

## 2023-01-01 DIAGNOSIS — R809 Proteinuria, unspecified: Secondary | ICD-10-CM | POA: Insufficient documentation

## 2023-01-01 MED ORDER — EMPAGLIFLOZIN 10 MG PO TABS
10.0000 mg | ORAL_TABLET | Freq: Every day | ORAL | 11 refills | Status: DC
Start: 2023-01-01 — End: 2023-07-24

## 2023-01-04 ENCOUNTER — Ambulatory Visit (INDEPENDENT_AMBULATORY_CARE_PROVIDER_SITE_OTHER): Payer: No Typology Code available for payment source | Admitting: Psychology

## 2023-01-04 DIAGNOSIS — F32A Depression, unspecified: Secondary | ICD-10-CM | POA: Diagnosis not present

## 2023-01-04 DIAGNOSIS — F410 Panic disorder [episodic paroxysmal anxiety] without agoraphobia: Secondary | ICD-10-CM | POA: Diagnosis not present

## 2023-01-04 DIAGNOSIS — F419 Anxiety disorder, unspecified: Secondary | ICD-10-CM

## 2023-02-01 ENCOUNTER — Ambulatory Visit (INDEPENDENT_AMBULATORY_CARE_PROVIDER_SITE_OTHER): Payer: No Typology Code available for payment source | Admitting: Psychology

## 2023-02-01 DIAGNOSIS — F32A Depression, unspecified: Secondary | ICD-10-CM

## 2023-02-01 DIAGNOSIS — F410 Panic disorder [episodic paroxysmal anxiety] without agoraphobia: Secondary | ICD-10-CM

## 2023-02-01 DIAGNOSIS — F419 Anxiety disorder, unspecified: Secondary | ICD-10-CM

## 2023-02-06 NOTE — Progress Notes (Signed)
SOAP Notes Session Summary Provider / Clinician's Name: Letta Moynahan Darreld Mclean, [place]  Client Name: Tonya Wilson  Date of Service: 12/21/2022 Duration: 60 mins.   Subjective: Client reports, "It has just been a lot lately Tonya Wilson. I am doing my best to work, work, work but with the cut hours and the cut pay it is just not enough to maintain my bills. I have been looking and looking for work but there is nothing out there and to be honest when I get off work I can not imagine working more or taking the time to apply for job after job because I am so mentally exhausted. I am still hopeful that my work will contact me back and there will be a full time position soon. I really can not afford to stay living where I am. I do not know what to do about that either because I would never have agreed to move into somewhere where the rent changes every month. I would not have been able to count on that and so I know that I would not have agreed to that. I have tried to talk to them about it but they do not care at all. The management is constantly changing and no one seem to know anything. The worst part is I do not have the funds to go anywhere else and so this is just consuming me at the moment. The client also touched on her relationship with her daughter. She also talked about boundaries she has put into place with her mother and her sisters. "Right now I have to take care of me and we will see where everything else lands when we get to that point."           Objective: Client is very stressed at the moment. This living situation is consuming her in every way because she can not get ahead of it. There is only so many hours and so much money. She feels stuck. It is apparent that she is in survival mode due to this situation. This is having a severe impact on her mental health. We spent a lot of time making sure that the client is taking the best care of herself that she can during this stressful  time. Given the stress she is still very clear that she does need support with her mental health and at this time it is imperative that she prioritize herself. The client is trying her best to keep all of the outside factors to a minimum so that she can focus on the bigger issues at hand. We discussed how she needs to work on one thing at a time until some of the stress subsides.           Assessment: The client responds well to therapy and reports that therapy is what she needs to normalize her thoughts and feelings. She reports that she feels better after she attends. It has been a while since the client has been to session but she reports that she plans to stay consistent for the well being of mental health. The absence with sessions is due to cut hours at work, gaps in insurance, and just feeling very overwhelmed even though she reports that therapy helps. It is very apparent by the end of session that is what she needs to maintain her mental health. She is more upbeat and animated by the end of her session. She is back to making jokes and appears more relaxed. Consistency is what  would be best to maintain her mental health.           Plan: Continue bi-weekly sessions with a goal of at least two per month. Client and therapist will attempt to change date if client is not able to make normally scheduled appointment. Next session we will complete a Comprehensive Clinical Assessment to further evaluate the needs of the client and to learn more in depth information from client. Client will implement a moment of mindfulness at the start and end to each day to clear mind and enter into the day with a fresh start. Client will practice box breathing when anxiety is running high, especially if the client feels that she could have an anxiety attack. Client reports anxiety at an 8 in these moments and would like to bring it down to a 3 when these moments occur.

## 2023-02-13 NOTE — Progress Notes (Signed)
Comprehensive Clinical Assessment (CCA) Note  Tonya Wilson [id number] 01/04/2023 Chief Complaint: No chief complaint on file.  Visit Diagnosis: Anxiety and Depression, panic disorder   CCA Screening, Triage and Referral (STR)  Patient Reported Information How did you hear about [place]? Client has been a patient at [facility] since 2019, doctor recommended Referral name: Felizardo Hoffmann Referral phone number: [phone]  Whom do you see for routine medical problems? Felizardo Hoffmann Practice/Facility Name: [facility] Practice/Facility Phone Number: [phone] Name of Contact: Felizardo Hoffmann Contact Number: [phone] Contact Fax Number: [phone] Prescriber Name: [facility] Prescriber Address (if known): [address]  What Is the Reason for Your Visit/Call Today? Therapy How Long Has This Been Causing You Problems? I have struggled with anxiety and depression for years. What Do You Feel Would Help You the Most Today? Weekly therapy sessions  Have You Recently Been in Any Inpatient Treatment (Hospital/Detox/Crisis Center/28-Day Program)? No Name/Location of Program/Hospital:N/A How Long Were You There? N/A When Were You Discharged? N/A  Have You Ever Received Services From [facility] Before? Yes Who Do You See at [facility]? Referrals and emergency room visits  Have You Recently Had Any Thoughts About Hurting Yourself? No, never Are You Planning to Commit Suicide/Harm Yourself At This time? No  Have you Recently Had Thoughts About Hurting Someone [name]? No Explanation: N/A  Have You Used Any Alcohol or Drugs in the Past 24 Hours? I drink occasionally on the weekends, maybe 2 or 3 per day. I smoke mariajuana most nights. How Long Ago Did You Use Drugs or Alcohol? Yesterday What Did You Use and How Much? Mariajuana, a few hits (maybe 3 or 4)  Do You Currently Have a Therapist/Psychiatrist? Yes Name of Therapist/Psychiatrist: Therapist, Roslynn Amble,  LCSWA  Have You Been Recently Discharged From Any Office Practice or Programs? No Explanation of Discharge From Practice/Program: N/A    CCA Screening Triage Referral Assessment Type of Contact: In person Is this Initial or Reassessment? Initial Date Telepsych consult ordered in [facility]: 12/21/2022 Time Telepsych consult ordered in [facility]:  Next session, client comes to therapy at 6:00 p.m.  Patient Reported Information Reviewed? Information received in office Patient Left Without Being Seen? No Reason for Not Completing Assessment: N/A  Collateral Involvement: No  Does Patient Have a Court Appointed Legal Guardian? No Name and Contact of Legal Guardian: N/A If Minor and Not Living with Parent(s), Who has Custody? N/A Is CPS involved or ever been involved? N/A Is APS involved or ever been involved? No  Patient Determined To Be At Risk for Harm To Self or Others Based on Review of Patient Reported Information or Presenting Complaint? No Method: N/A Availability of Means: N/A Intent: N/A Notification Required: N/A Additional Information for Danger to Others Potential: N/A Additional Comments for Danger to Others Potential: N/A Are There Guns or Other Weapons in Your Home? N/A Types of Guns/Weapons: N/A Are These Weapons Safely Secured?                            N/A Who Could Verify You Are Able To Have These Secured: N/A Do You Have any Outstanding Charges, Pending Court Dates, Parole/Probation? No Contacted To Inform of Risk of Harm To Self or Others: No  Location of Assessment: In office, [facility]  Does Patient Present under Involuntary Commitment? No IVC Papers Initial File Date: N/A  [place] of Residence: [place]  Patient Currently Receiving the Following Services: Help with  food insecurity  Determination of Need: Moderate  Options For Referral: Local food pantries    CCA Biopsychosocial Intake/Chief Complaint:  Anxiety and depression Current Symptoms/Problems: High  anxiety and moments of feeling low   Patient Reported Schizophrenia/Schizoaffective Diagnosis in Past: No  Strengths: hard working, confident, easy going, funny, resilient, persistent, perseverance  Preferences: organization, stability Abilities: IT related jobs, dependable, responsible, loyal, determined  Type of Services Patient Feels are Needed: help with food, possible help with bills  Initial Clinical Notes/Concerns: Patient has high anxiety at times, it can be debilitating. Client generally pushes through but it can be tough.  Mental Health Symptoms Depression:  Yes  Duration of Depressive symptoms: Years  Mania:  No  Anxiety:   Yes  Psychosis:  No  Duration of Psychotic symptoms: No  Trauma:  Yes, specifically from the past.  Obsessions:  No  Compulsions:  No  Inattention:  At times if anxiety is really high.  Hyperactivity/Impulsivity:  No  Oppositional/Defiant Behaviors:  No  Emotional Irregularity:  Yes, has pent up anger that will come out when pushed.  Other Mood/Personality Symptoms:  No   Mental Status Exam Appearance and self-care  Stature:  Normal  Weight:  Normal  Clothing:  Clean, well kept  Grooming:  Well groomed  Cosmetic use:  None  Posture/gait:  Normal  Motor activity:  Normal  Sensorium  Attention:  Has tendency to loose focus at times  Concentration:  Normal  Orientation:  A + O  Recall/memory:  Normal  Affect and Mood  Affect:  Can be emotional and anxious at times  Mood:  Mostly a good disposition  Relating  Eye contact:  Normal  Facial expression:  Normal  Attitude toward examiner:  Pleasant  Thought and Language  Speech flow: Normal, well spoken  Thought content:  Normal  Preoccupation:  Normal  Hallucinations:  None  Organization:  Normal  Company secretary of Knowledge:  Normal  Intelligence:  Normal  Abstraction:  Normal  Judgement:  Normal  Reality Testing:  Normal  Insight:  Normal  Decision Making:  Normal   Social Functioning  Social Maturity:  Normal  Social Judgement:  Normal  Stress  Stressors:  Money, family  Coping Ability:  Normal  Skill Deficits:  Anger  Supports:  Family, cat, friends, [facility]    Religion: Christian  Leisure/Recreation:Working on getting the client out of the house to start hanging out with friends or being involved with things that might bring her joy. Client will visit family and run errands.   Exercise/Diet: Client eats a health diet, she does not exercise regularly   CCA Employment/Education Employment/Work Situation: Client is employed full time. She has a job doing Consulting civil engineer.   Education: [employer] diploma    CCA Family/Childhood History Family and Relationship History: Client has trauma with family from the past. The relationship with her mother and sisters can be strained at times, but they always work it out. Client has had several romantic relationships in the past. There is a history of ups and downs, until the downs outweigh the ups and the client feels the need to end them. The last relationship was very tough and that is what started her journey with therapy. She has a daughter who is not her biological daughter by blood but has always treated her like she is. She has raised her and only in the last year have they taken a break from one another. The daughter went back to live  with her mother but quickly realized that the client was her "mother" and is coming back to live with her.    Childhood History:  Childhood was challenging at times. Client struggled with mental health in teen years because of this. Client has spent a lot of time working through that to repair relationships with her family.   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 The client reports that she spent a lot of time working through that and did not drink for a long time. Client  reports that she only drinks socially on the weekends, 2-3 at most. Client reports that she does smoke mariajuana most night, 3-4 hits at most to calm her nerves.     ASAM's:  Six Dimensions of Multidimensional Assessment  Dimension 1:  Acute Intoxication and/or Withdrawal Potential: None    Dimension 2:  Biomedical Conditions and Complications:  Her past drinking has caused problems with her body. She reports that she tries to make sure that she is taking the best care of herself that she can.    Dimension 3:  Emotional, Behavioral, or Cognitive Conditions and Complications:  None  Dimension 4:  Readiness to Change:  None  Dimension 5:  Relapse, Continued use, or Continued Problem Potential:  Continued use, but client feels this is where she is realistically and has no desire to go backwards.  Dimension 6:  Recovery/Living Environment:  N/A  ASAM Severity Score:    ASAM Recommended Level of Treatment:     Substance use Disorder (SUD)   Recommendations for Services/Supports/Treatments:Continue substance use assessment and weekly check ins.    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/2020   Malignant neoplasm of pituitary gland (HCC) 02/26/2020   Migraine with aura and with status migrainosus, not intractable 02/26/2020   Increased frequency of headaches 02/26/2020   Persistent proteinuria 02/26/2020   Premature infant with birthweight 1000-2499 grams 02/26/2020   Migraine without aura and without status migrainosus, not intractable 12/25/2019   Raynaud's phenomenon without gangrene 12/25/2019   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   Constipation 06/25/2019   Menstrual migraine  without status migrainosus, not intractable 06/25/2019   Hypertension    Infertility, female    Asthma    Seasonal allergies    Uterine leiomyoma 07/25/2016   Prematurity 07/25/1980    Patient Centered Plan: Patient is on the following Treatment Plan(s):  Anxiety, depression, and occasional panic disorder   Referrals to Alternative Service(s): Referred to Alternative Service(s):   Place:   Date:   Time:    Referred to Alternative Service(s):   Place:   Date:   Time:    Referred to Alternative Service(s):   Place:   Date:   Time:    Referred to Alternative Service(s):   Place:   Date:   Time:      Collaboration of Care:  Patient/Guardian was advised Release of Information must be obtained prior to any record release in order to collaborate their care with an outside provider. Patient/Guardian was advised if they have not already done so to contact the registration department  to sign all necessary forms in order for [place] to release information regarding their care.   Consent: Patient/Guardian gives verbal consent for treatment and assignment of benefits for services provided during this visit. Patient/Guardian expressed understanding and agreed to proceed.   Letta Moynahan T [place], [place]

## 2023-02-15 ENCOUNTER — Other Ambulatory Visit: Payer: No Typology Code available for payment source | Admitting: Psychology

## 2023-02-22 ENCOUNTER — Ambulatory Visit: Payer: No Typology Code available for payment source | Admitting: Psychology

## 2023-02-22 DIAGNOSIS — F410 Panic disorder [episodic paroxysmal anxiety] without agoraphobia: Secondary | ICD-10-CM

## 2023-02-22 DIAGNOSIS — F419 Anxiety disorder, unspecified: Secondary | ICD-10-CM

## 2023-02-22 DIAGNOSIS — F32A Depression, unspecified: Secondary | ICD-10-CM

## 2023-02-23 ENCOUNTER — Ambulatory Visit (INDEPENDENT_AMBULATORY_CARE_PROVIDER_SITE_OTHER): Payer: No Typology Code available for payment source | Admitting: Internal Medicine

## 2023-02-23 ENCOUNTER — Encounter: Payer: Self-pay | Admitting: Internal Medicine

## 2023-02-23 VITALS — BP 136/94 | HR 100 | Resp 49 | Ht 62.0 in | Wt 153.0 lb

## 2023-02-23 DIAGNOSIS — J4521 Mild intermittent asthma with (acute) exacerbation: Secondary | ICD-10-CM | POA: Diagnosis not present

## 2023-02-23 DIAGNOSIS — Z9109 Other allergy status, other than to drugs and biological substances: Secondary | ICD-10-CM | POA: Diagnosis not present

## 2023-02-23 MED ORDER — ALBUTEROL SULFATE HFA 108 (90 BASE) MCG/ACT IN AERS: 1.0000 | INHALATION_SPRAY | Freq: Four times a day (QID) | RESPIRATORY_TRACT | 2 refills | Status: DC | PRN

## 2023-02-23 MED ORDER — FLUTICASONE FUROATE-VILANTEROL 100-25 MCG/ACT IN AEPB: 1.0000 | INHALATION_SPRAY | Freq: Every day | RESPIRATORY_TRACT | 1 refills | Status: DC

## 2023-02-23 NOTE — Progress Notes (Signed)
Subjective:    Patient ID: Tonya Wilson, female   DOB: 12-Aug-1980, 42 y.o.   MRN: [id number]   HPI  Moved into a new apartment on the 19th of July.  Apartment was very dirty.  From other sources, have heard she moved in without looking at the apartment.  She states apartment is roach infested and carpet is filthy.  The office manager is in process of having the apartment cleaned.   Taking Zyrtec, but not Flonase. States her lungs are burning as they did when she had problems with asthma as a child.   Used Advair in the past    Current Meds  Medication Sig   acetaminophen (TYLENOL) 500 MG tablet Take 1,000 mg by mouth every 6 (six) hours as needed. For pain   amLODipine (NORVASC) 5 MG tablet TAKE 1 TABLET(5 MG) BY MOUTH DAILY   cabergoline (DOSTINEX) 0.5 MG tablet Take 1 tablet (0.5 mg total) by mouth 2 (two) times a week.   cetirizine (ZYRTEC) 10 MG tablet Take 1 tablet (10 mg total) by mouth daily.   Cholecalciferol (VITAMIN D3) 25 MCG (1000 UT) CAPS Take 1 capsule (1,000 Units total) by mouth daily.   clobetasol cream (TEMOVATE) 0.05 % Apply 1 application topically 2 (two) times daily.   clonazePAM (KLONOPIN) 1 MG tablet 1/2 to 1 tab by mouth twice daily only as needed for anxiety   cloNIDine (CATAPRES) 0.1 MG tablet TAKE 2 TABLETS BY MOUTH THREE TIMES DAILY   DULoxetine (CYMBALTA) 60 MG capsule Take 1 capsule (60 mg total) by mouth daily.   losartan (COZAAR) 25 MG tablet 1 tab by mouth daily   ondansetron (ZOFRAN) 4 MG tablet Take 1 tablet (4 mg total) by mouth every 8 (eight) hours as needed for nausea or vomiting.   polyethylene glycol powder (MIRALAX) 17 GM/SCOOP powder 17 g in 8 ounces water or juice once to twice daily (Patient taking differently: as needed. 17 g in 8 ounces water or juice once to twice daily)   Rimegepant Sulfate (NURTEC) 75 MG TBDP Take 1 tablet (75 mg total) by mouth daily as needed (take for abortive therapy of migraine, no more than 1 tablet in 24  hours or 10 per month).   sodium bicarbonate 650 MG tablet Take 650 mg by mouth 2 (two) times daily.   traZODone (DESYREL) 50 MG tablet 1 to 2 tabs BY MOUTH AT BEDTIME (Patient taking differently: as needed. 1 to 2 tabs BY MOUTH AT BEDTIME)   Allergies  Allergen Reactions   Other Other (See Comments)    NO NSAIDS 2/2 CKD PER PT   Flagyl [Metronidazole] Rash     Review of Systems    Objective:   BP (!) 136/94 (BP Location: Left Arm, Patient Position: Sitting, Cuff Size: Normal)   Pulse 100   Resp (!) 49   Ht 5\' 2"  (1.575 m)   Wt 153 lb (69.4 kg)   BMI 27.98 kg/m   Physical Exam NAD RR 20 at time of visit and unlabored.  Unable to get pulse ox to function at time of visit. HEENT:  PERRL, EOMI, TMs pearly gray.  Throat without injection.  Nasal mucosa swollen/boggy with clear discharge.  NT over sinuses Neck:  Supple, No adenopathy Chest:  good air movement and clear without wheeze or crackles. CV:  RRR without murmur or rub.  Radial pulses normal and equal   Assessment & Plan   Environmental allergies with lower respiratory tract symptoms:  Appears  Breo Ellipta covered--to start one inhalation daily until apartment cleaned adequately.  Albuterol for rescue inhaler.   Also, she reportedly was able to arrange living elsewhere until her unit is cleaned.

## 2023-02-24 ENCOUNTER — Other Ambulatory Visit: Payer: Self-pay

## 2023-02-24 ENCOUNTER — Telehealth: Payer: Self-pay

## 2023-02-24 DIAGNOSIS — I151 Hypertension secondary to other renal disorders: Secondary | ICD-10-CM

## 2023-02-24 NOTE — Telephone Encounter (Signed)
Patient called to report that her insurance does not cover much of the [facility]. Patient portion that she must pay is about $388.  Patient also wanted to remind that jardiance was a similar situation.

## 2023-03-06 ENCOUNTER — Telehealth: Payer: Self-pay | Admitting: Psychology

## 2023-03-06 NOTE — Telephone Encounter (Signed)
Spoke with the pharmacy and found that there is no recommended alternative to medication. Medication is a preferred brand, but patients insurance has a high deductible.

## 2023-03-09 NOTE — Telephone Encounter (Signed)
Please call her and see what she wants to do--her insurance does not cover her preferred brands significantly--it's a concern and wondering if she would like to work with Legal Aid to find perhaps and ACA that covers better?  Not sure if possible.

## 2023-03-10 NOTE — Telephone Encounter (Signed)
Spoke with patient about medication prices. She does not want to pay for those medications currently. She mentioned that she is already in contact with Legal Aid about a different matter and will speak to them about getting help with insurance navigating.

## 2023-03-20 ENCOUNTER — Ambulatory Visit: Payer: No Typology Code available for payment source | Admitting: Internal Medicine

## 2023-03-24 ENCOUNTER — Other Ambulatory Visit: Payer: Self-pay | Admitting: Internal Medicine

## 2023-03-24 DIAGNOSIS — I151 Hypertension secondary to other renal disorders: Secondary | ICD-10-CM

## 2023-03-31 ENCOUNTER — Ambulatory Visit: Payer: No Typology Code available for payment source | Admitting: Psychology

## 2023-03-31 DIAGNOSIS — F410 Panic disorder [episodic paroxysmal anxiety] without agoraphobia: Secondary | ICD-10-CM

## 2023-03-31 DIAGNOSIS — F419 Anxiety disorder, unspecified: Secondary | ICD-10-CM | POA: Diagnosis not present

## 2023-03-31 DIAGNOSIS — F32A Depression, unspecified: Secondary | ICD-10-CM

## 2023-04-07 ENCOUNTER — Other Ambulatory Visit: Payer: Self-pay

## 2023-04-07 MED ORDER — FLUTICASONE PROPIONATE 50 MCG/ACT NA SUSP: 2.0000 | Freq: Every day | NASAL | 6 refills | Status: DC

## 2023-04-07 MED ORDER — CETIRIZINE HCL 10 MG PO TABS: 10.0000 mg | ORAL_TABLET | Freq: Every day | ORAL | 11 refills | Status: DC

## 2023-04-12 ENCOUNTER — Ambulatory Visit (INDEPENDENT_AMBULATORY_CARE_PROVIDER_SITE_OTHER): Payer: No Typology Code available for payment source | Admitting: Psychology

## 2023-04-12 ENCOUNTER — Ambulatory Visit (INDEPENDENT_AMBULATORY_CARE_PROVIDER_SITE_OTHER): Payer: No Typology Code available for payment source

## 2023-04-12 VITALS — BP 124/88 | HR 80

## 2023-04-12 DIAGNOSIS — F410 Panic disorder [episodic paroxysmal anxiety] without agoraphobia: Secondary | ICD-10-CM

## 2023-04-12 DIAGNOSIS — F419 Anxiety disorder, unspecified: Secondary | ICD-10-CM

## 2023-04-12 DIAGNOSIS — Z0130 Encounter for examination of blood pressure without abnormal findings: Secondary | ICD-10-CM

## 2023-04-12 DIAGNOSIS — F32A Depression, unspecified: Secondary | ICD-10-CM | POA: Diagnosis not present

## 2023-04-12 DIAGNOSIS — N183 Chronic kidney disease, stage 3 unspecified: Secondary | ICD-10-CM | POA: Diagnosis not present

## 2023-04-12 DIAGNOSIS — E559 Vitamin D deficiency, unspecified: Secondary | ICD-10-CM

## 2023-04-13 ENCOUNTER — Ambulatory Visit (INDEPENDENT_AMBULATORY_CARE_PROVIDER_SITE_OTHER): Payer: No Typology Code available for payment source | Admitting: Psychology

## 2023-04-13 DIAGNOSIS — F410 Panic disorder [episodic paroxysmal anxiety] without agoraphobia: Secondary | ICD-10-CM

## 2023-04-13 DIAGNOSIS — Z09 Encounter for follow-up examination after completed treatment for conditions other than malignant neoplasm: Secondary | ICD-10-CM

## 2023-04-13 DIAGNOSIS — F419 Anxiety disorder, unspecified: Secondary | ICD-10-CM

## 2023-04-13 DIAGNOSIS — F32A Depression, unspecified: Secondary | ICD-10-CM

## 2023-04-13 LAB — BASIC METABOLIC PANEL
BUN/Creatinine Ratio: 10 (ref 9–23)
BUN: 15 mg/dL (ref 6–24)
CO2: 19 mmol/L — ABNORMAL LOW (ref 20–29)
Calcium: 8.9 mg/dL (ref 8.7–10.2)
Chloride: 108 mmol/L — ABNORMAL HIGH (ref 96–106)
Creatinine, Ser: 1.5 mg/dL — ABNORMAL HIGH (ref 0.57–1.00)
Glucose: 94 mg/dL (ref 70–99)
Potassium: 4.6 mmol/L (ref 3.5–5.2)
Sodium: 141 mmol/L (ref 134–144)
eGFR: 45 mL/min/{1.73_m2} — ABNORMAL LOW (ref >59)

## 2023-04-13 LAB — MICROALBUMIN / CREATININE URINE RATIO
Creatinine, Urine: 102.5 mg/dL
Microalb/Creat Ratio: 356 mg/g creat — ABNORMAL HIGH (ref 0–29)
Microalbumin, Urine: 364.5 ug/mL

## 2023-04-13 LAB — VITAMIN D 25 HYDROXY (VIT D DEFICIENCY, FRACTURES): Vit D, 25-Hydroxy: 29.5 ng/mL — ABNORMAL LOW (ref 30.0–100.0)

## 2023-04-18 ENCOUNTER — Ambulatory Visit: Payer: No Typology Code available for payment source | Admitting: Psychology

## 2023-04-18 DIAGNOSIS — Z09 Encounter for follow-up examination after completed treatment for conditions other than malignant neoplasm: Secondary | ICD-10-CM

## 2023-04-26 ENCOUNTER — Ambulatory Visit (INDEPENDENT_AMBULATORY_CARE_PROVIDER_SITE_OTHER): Payer: No Typology Code available for payment source | Admitting: Psychology

## 2023-04-26 DIAGNOSIS — F410 Panic disorder [episodic paroxysmal anxiety] without agoraphobia: Secondary | ICD-10-CM | POA: Diagnosis not present

## 2023-04-26 DIAGNOSIS — F32A Depression, unspecified: Secondary | ICD-10-CM

## 2023-04-26 DIAGNOSIS — F419 Anxiety disorder, unspecified: Secondary | ICD-10-CM | POA: Diagnosis not present

## 2023-05-12 ENCOUNTER — Ambulatory Visit (INDEPENDENT_AMBULATORY_CARE_PROVIDER_SITE_OTHER): Payer: No Typology Code available for payment source | Admitting: Psychology

## 2023-05-12 DIAGNOSIS — F419 Anxiety disorder, unspecified: Secondary | ICD-10-CM

## 2023-05-12 DIAGNOSIS — F32A Depression, unspecified: Secondary | ICD-10-CM | POA: Diagnosis not present

## 2023-05-12 DIAGNOSIS — F410 Panic disorder [episodic paroxysmal anxiety] without agoraphobia: Secondary | ICD-10-CM

## 2023-05-16 NOTE — Progress Notes (Signed)
SOAP Notes Session Summary Provider / Clinician's Name: Letta Moynahan Darreld Mclean, [place]  Client Name: Tonya Wilson  Date of Service: 02/01/2023  Duration: 60 mins.   Subjective: Client reports, "I am doing better now that I have decided that I do not want to live in this place anymore. It is too much money and I can not rely on any type of normalcy. It is different every single time. Now that I have made this decision I just need to find a place to live. I am not stressed about that. I am going to tour a couple of places next week and I will sign a lease as soon as I find one. I am just so happy to be able to get out of there. It will relieve so much stress." The client did discuss her work and how she was still struggling with the reduced money but was also hopeful that the move would help.   Objective: While the client still seemed to be frustrated about the circumstances of how everything with this place to live turned out, she did appear at peace with the decision that she has made to move on. She was excited to go look at apartments. It will feel like a fresh start. Her overall mental health is improving.  Assessment: The client continues to respond well to therapy and reports that therapy is what she needs to normalize her thoughts and feelings. She still reports that she feels better after she attends.  Consistency is what is best for the well being of her mental health. It is always very apparent by the end of session that the interventions are helping her to maintain and grow. She is always more upbeat and animated by the end of her session.   Plan: Continue bi-weekly sessions with a goal of at least two per month. Client and therapist will attempt to change date if client is not able to make normally scheduled appointment. Client will implement a moment of mindfulness at the start and end to each day to clear mind and enter into the day with a fresh start. Client will practice box  breathing when anxiety is running high, especially if the client feels that she could have an anxiety attack. Client reports anxiety at an 8 in these moments and would like to bring it down to a 3 when these moments occur. This week the client reports that anxiety is at 6 and would like to bring this down to a 3 with continued use of interventions. Client will start writing down positive affirmations down in a book to help boost self confidence.

## 2023-05-16 NOTE — Progress Notes (Signed)
Treatment Plan The Counseling Treatment Plan is a dynamic document that is regularly reviewed and updated as the client's needs and circumstances change over the course of counseling. The plan serves as a roadmap for the counseling process and helps ensure that the client and counselor work together toward common goals.  Section I. Client Information  Client name: Tonya Wilson  Established Date: 02/22/2023  Therapist name: Letta Moynahan Darreld Mclean, LCSWA  Section II. Presenting Concerns   Brief Description of Concerns Client has a lot of emtional irregularity. Client has uncontrolable anger. She has worked very hard to use interventions to control the anger. Client suffers from GAD and mild depression. Recently the client has suffered a sudden loss with the death of her mother and is having a very hard time coping. Goals are being adjusted.   Goals for Counseling  1) Developing coping skills 2) Improve emotional regulation 3) Reducing symptoms of depression and GAD 4) Increase awareness of patterns that lead to anger   Section Ill. Assessment Summary  Summary of Assessment Findings  The client has been through very tough times. The client is in need of emotional regulation to help reduce anger. The client has done a lot of work to identify these patterns to remain calm. The client is willing and eager to put in the work.   Strengths and Resources: Strengths include resiliency, bravery, independence, strength, faith.  Resources: Family, friends, myself, work, Dr. Delrae Alfred  Areas for Improvement: Decrease stress, decrease anger, stabilize mood.  Section IV. Treatment Objectives   Measurable objectives Strategies/Interventions 1) Attend weekly therapy sessions that focus on Cognitive Behavioral Therapy. With a goal of 4 sessions per month. 2) Reduce symptoms of depression and GAD from a level from 6 to a 4. Practice mindfulness and  box breathing techniques daily. 3) Improve coping skills. Learning relaxation techniques, problem-solving skills, and healthy lifestyle habits to manage triggers. 4) Understand patterns of anger through regular journaling about daily living. Spend 10 minutes each day writing down thoughts and feelings to help identify situations that cause emotional irregularity.  Section V. Action Plan  Summary of Strategies/ Interventions Roles and Responsibilities        Timeline for Completion   1) Attend 2 sessions per month. The client will attend every other  week because it is very valuable for her mental health adapting to schedules. Client- prioritizes time for sessions Me- check-ins and flexibility Evaluate once a month.   2) Decrease levels of depression and GAD from 6 to 3. Practice mindfulness techniques daily learned in therapy. Mindfulness and breathing  Client- committed to 5 mins per day Me- teach techniques and recenter each session  3 months, check in weekly.  3) Improve coping skills by keeping a daily schedule, develop problem solving appraches, practice postive thinking, and avoid substances. Client- following through with schedules and interventions Me-helping guide and advise schedule making Two weeks, evaluate outcomes.    4) Spend 10 minutes a day writing down thoughts and feelings to help understand your emotional patterns better.  Client- spend 10 mins. a day writing Me-help establish patterns Two weeks and evaluate       Additional notes: Reviewed: current date 02/22/2023 Ongoing, Achieved, Revised

## 2023-05-26 ENCOUNTER — Ambulatory Visit (INDEPENDENT_AMBULATORY_CARE_PROVIDER_SITE_OTHER): Payer: No Typology Code available for payment source | Admitting: Psychology

## 2023-05-26 DIAGNOSIS — F419 Anxiety disorder, unspecified: Secondary | ICD-10-CM | POA: Diagnosis not present

## 2023-05-26 DIAGNOSIS — F410 Panic disorder [episodic paroxysmal anxiety] without agoraphobia: Secondary | ICD-10-CM

## 2023-05-26 DIAGNOSIS — F32A Depression, unspecified: Secondary | ICD-10-CM | POA: Diagnosis not present

## 2023-05-26 DIAGNOSIS — Z09 Encounter for follow-up examination after completed treatment for conditions other than malignant neoplasm: Secondary | ICD-10-CM

## 2023-06-01 NOTE — Progress Notes (Signed)
DAP Note Provider/Clinician's Name: Letta Moynahan Darreld Mclean, [place]   Client Name: Tonya Wilson   Date of Service: 03/31/2023    Duration: 60 mins.         Data: Client reports, "I am not doing well at all. I was so excited to move to my new place and then there have been so many issues, as you know. They are dragging their feet on fixing things. I am sick of fighting with them to be honest,. I thought this was going to be good and it honestly has been the worst thing that I could have gone through. I am trying to make the best of it at this point. I am sick of fighting with them. It took such a toll on my physical and mental health that I just do not have it in me to say anything else."   Assessment: The client was having a hard time and she has been doing her best to move past everything that she has been through. She has been keeping me informed about the move via the phone and had to cancel several sessions because of the continued stress and ongoing apartment issues. There were a lot of things wrong when she moved in and this has taken a toll on her overall health. With that being said, the client continues to respond well to therapy. She still does report, "that therapy is what she needs to normalize her thoughts and feelings and that she feels better when she attends." We talked about how consistency is what is best for the well being of her mental health, especially with all the stress. When she misses a session or two the emotions begin to pile up. It is always very apparent by the end of session that the interventions are helping her to maintain and grow. She is always more upbeat and animated by the end of her session and her new outlook on everything is also helping.    Plan: Continue bi-weekly sessions with a goal of at least two per month. Client and therapist will attempt to change date if client is not able to make normally scheduled appointment. Client will implement a moment of  mindfulness at the start and end to each day to clear mind and enter into the day with a fresh start. Client will practice box breathing when anxiety is running high, especially if the client feels that she could have an anxiety attack. Client reports anxiety at an 8 in these moments and would like to bring it down to a 3 when these moments occur. This week the client reports that anxiety is at 6 and would like to bring this down to a 3 with continued use of interventions. Client will start writing down positive affirmations in a book to help boost self confidence.

## 2023-06-02 ENCOUNTER — Other Ambulatory Visit: Payer: No Typology Code available for payment source | Admitting: Psychology

## 2023-06-04 ENCOUNTER — Telehealth: Payer: Self-pay | Admitting: Psychology

## 2023-06-05 ENCOUNTER — Ambulatory Visit (INDEPENDENT_AMBULATORY_CARE_PROVIDER_SITE_OTHER): Payer: No Typology Code available for payment source | Admitting: Psychology

## 2023-06-05 DIAGNOSIS — F419 Anxiety disorder, unspecified: Secondary | ICD-10-CM

## 2023-06-05 DIAGNOSIS — F410 Panic disorder [episodic paroxysmal anxiety] without agoraphobia: Secondary | ICD-10-CM

## 2023-06-05 DIAGNOSIS — F32A Depression, unspecified: Secondary | ICD-10-CM

## 2023-06-05 NOTE — Progress Notes (Signed)
DAP Note  Provider/Clinician's Name: Letta Moynahan Darreld Mclean, [place]   Client Name: Tonya Wilson   Date of Service: 04/12/2023    Duration: 60 mins.         Data: Client reports, "I am doing a little better. My mom told me to make the best of it and so I am really trying to follow through with that. I am to the point where they have fixed what they are going to fix and I am trying my best to fix up what I can." Client also reported that she has been spending more time with her mom and that has been nice. She also talked about how she is going to have a birthday party. It has been years because her dad died around her birthday so she has not celebrated in a long time. This week the client reports that anxiety is at 6 and would like to bring this down to a 3 with continued use of interventions   Assessment: The client was having a hard time and she has been doing her best to move past everything that she has been through. However, the client does respond well to therapy. Reporting that, "therapy is what she needs to normalize her thoughts and feelings and that she feels better when she attends." We talked about how consistency is what is best for the well being of her mental health, especially with all the stress. When she misses a session or two the emotions begin to pile up. It is very hopeful that she is letting it go and making the best of her apartment but that also she is having a party. It proves she is trying to move forward.   Plan: Continue bi-weekly sessions with a goal of at least two per month. Client will implement a moment of mindfulness at the start and end to each day to clear mind and enter into the day with a fresh start. Client will practice box breathing when anxiety is running high. Client reports anxiety at an 8 in these moments and would like to bring it down to a 3 when these moments occur. Client will start writing down positive affirmations in a book to help boost self  confidence.

## 2023-06-06 NOTE — Progress Notes (Signed)
Case Management Client called into office around lunch. Her mother passed away unexpectedly. Client was having a panic attack and was very tearful and upset. Per the request of the client [place] and I went to be with the family. We were there for several hours offering condolences and support. Will check on the client tomorrow and see how we can help moving forward.

## 2023-06-11 NOTE — Progress Notes (Signed)
Case Management Filled out paperwork for FMLA due to mother passing unexpectedly so client can take some time off work. Checked on client via the phone to see how she is doing. Will check on the client again in a few days.

## 2023-06-12 NOTE — Progress Notes (Signed)
DAP Note   Provider/Clinician's Name: Letta Moynahan Darreld Mclean, [place]   Client Name: Tonya Wilson   Date of Service: 04/12/2023    Duration: 60 mins.         Data: Client reports, "this has been the hardest time in my life. I still can not believe that my mom passed away right before my birthday, just like my dad. I really do not think that I have wrapped my head around it fully. I just keep thinking that she can not be gone." Client talked about how it has been hard for her to even get out of bed. She also does not know how she will be able to work and that leads to worrying how she will pay her bills. Client reports stress and anxiety at a 10 with everything that has happened.   Assessment: The client is not well. She is heart broken and trying to process everything that has happened. She is still dealing with so much, it has not even slowed down. From the [employer], finding a funeral home they can afford, having to find her mother because they sent her for an autopsy and did not tell the family, just to name a few. It is all over whelming and feeling like a blur. We talked about how it is okay to not be okay.    Plan: Change to weekly sessions with a goal of  4 per month. Client will implement a moment of mindfulness at the start and end to each day to clear mind. Client will practice box breathing when anxiety and stress are running high. Client reports anxiety and stress are currently at an 10 and would like to reduce this to a 3 with the use of interventions. Client will start writing down positive affirmations in a book to help boost self confidence. Specific grief interventions will begin to get worked on in the next few weeks to help with emotional irregularity.

## 2023-06-12 NOTE — Addendum Note (Signed)
Addended by: Elmyra Ricks T on: 06/12/2023 12:22 PM   Modules accepted: Level of Service

## 2023-06-13 ENCOUNTER — Ambulatory Visit (INDEPENDENT_AMBULATORY_CARE_PROVIDER_SITE_OTHER): Payer: No Typology Code available for payment source | Admitting: Psychology

## 2023-06-13 DIAGNOSIS — F419 Anxiety disorder, unspecified: Secondary | ICD-10-CM | POA: Diagnosis not present

## 2023-06-13 DIAGNOSIS — F410 Panic disorder [episodic paroxysmal anxiety] without agoraphobia: Secondary | ICD-10-CM

## 2023-06-13 DIAGNOSIS — F32A Depression, unspecified: Secondary | ICD-10-CM | POA: Diagnosis not present

## 2023-06-13 NOTE — Progress Notes (Addendum)
            DAP Note   Provider/Clinician's Name: Letta Moynahan Darreld Mclean, [place]   Client Name: Tonya Wilson   Date of Service: 06/13/2023    Duration: 60 mins.         Data: Client reports, "I am just so sick of getting the run around in regards to this apartment. They come in and halfway fix stuff. They interrupt me while I am working. They act like it is no big deal and I still can not breath. I am just so upset all the time."  The client stated that she just has to get out of there. It has become so hard on her mentally and physically that she knows moving is the only option at this point. Client reports stress and anxiety have come back up to a 10 this week and would like to continue working to get that down to a 3 with the use of interventions.   Assessment: The client is confident that moving is the only way forward. We will begin the process of advocating to the owner to let her out of her lease due to the conditions of the apartment. It continues to need so much work and attention. We are continuing to work through the grieving process but it has been difficult because she is dealing with so much stuff in life and that has made it hard for her to have the proper time to work though all of it. Right now she is just sad and trying to get through.     Plan: Weekly sessions with a goal of 4 per month. Client will implement a moment of mindfulness at the start and end to each day to clear mind. Client will practice box breathing when anxiety and stress are running high. Client will start writing down positive affirmations in a book to help boost self confidence. Begin working through acceptance, processing, with the goal of adjusting to the stage of grief that the client is currently in.

## 2023-06-18 NOTE — Progress Notes (Signed)
DAP Note   Provider/Clinician's Name: Letta Moynahan Darreld Mclean, [place]   Client Name: Tonya Wilson   Date of Service: 05/12/2023    Duration: 60 mins.         Data: Client reports, "I am still not doing well at all. I just miss my mom so much (client is tearful). I just can not believe this happened. I am still trying to wrap my head around that." Client also discussed how stressful the situation with her mom has been. The client stated that they sent the body to [facility] without even telling the family and she is still there, so they did not even know where she was. On top of that finding a funeral home that they can afford has also been a challenge. Client reports stress and anxiety are still at a 10 with everything that has happened.   Assessment: The client is not well. She is still so heart broken and trying to process everything that has happened, which is understandable. She is still dealing with so much, it has not even slowed down. With everything happening it is like she having to relive it over and over again. There has not been a time where she can just grieve.    Plan: Change to weekly sessions with a goal of  4 per month. Client will implement a moment of mindfulness at the start and end to each day to clear mind. Client will practice box breathing when anxiety and stress are running high. Client reports anxiety and stress are currently at an 10 and would like to reduce this to a 3 with the use of interventions. Client will start writing down positive affirmations in a book to help boost self confidence. Specific grief interventions will begin to get worked on in the next few weeks to help with emotional irregularity.

## 2023-06-19 ENCOUNTER — Ambulatory Visit: Payer: No Typology Code available for payment source | Admitting: Psychology

## 2023-06-19 DIAGNOSIS — F419 Anxiety disorder, unspecified: Secondary | ICD-10-CM | POA: Diagnosis not present

## 2023-06-19 DIAGNOSIS — F32A Depression, unspecified: Secondary | ICD-10-CM

## 2023-06-19 DIAGNOSIS — F410 Panic disorder [episodic paroxysmal anxiety] without agoraphobia: Secondary | ICD-10-CM | POA: Diagnosis not present

## 2023-06-27 ENCOUNTER — Ambulatory Visit: Payer: No Typology Code available for payment source | Admitting: Psychology

## 2023-06-27 DIAGNOSIS — F32A Depression, unspecified: Secondary | ICD-10-CM | POA: Diagnosis not present

## 2023-06-27 DIAGNOSIS — F419 Anxiety disorder, unspecified: Secondary | ICD-10-CM

## 2023-06-27 DIAGNOSIS — F410 Panic disorder [episodic paroxysmal anxiety] without agoraphobia: Secondary | ICD-10-CM | POA: Diagnosis not present

## 2023-06-30 NOTE — Progress Notes (Signed)
Comprehensive Clinical Assessment (CCA) Note   Tonya Wilson [id number] 05/26/2023 Chief Complaint: No chief complaint on file.   Visit Diagnosis: Anxiety and Depression, panic disorder     CCA Screening, Triage and Referral (STR)   Patient Reported Information How did you hear about [place]? Client has been a patient at [facility] since 2019, doctor recommended Referral name: Tonya Wilson Referral phone number: [phone]   Whom do you see for routine medical problems? Tonya Wilson Practice/Facility Name: [facility] Practice/Facility Phone Number: [phone] Name of Contact: Tonya Wilson Contact Number: [phone] Contact Fax Number: [phone] Prescriber Name: [facility] Prescriber Address (if known): [address]   What Is the Reason for Your Visit/Call Today? Therapy How Long Has This Been Causing You Problems? I have struggled with anxiety and depression for years. What Do You Feel Would Help You the Most Today? Weekly therapy sessions   Have You Recently Been in Any Inpatient Treatment (Hospital/Detox/Crisis Center/28-Day Program)? No Name/Location of Program/Hospital:N/A How Long Were You There? N/A When Were You Discharged? N/A   Have You Ever Received Services From [facility] [facility] Before? Yes Who Do You See at [facility]? Referrals and emergency room visits   Have You Recently Had Any Thoughts About Hurting Yourself? No, never Are You Planning to Commit Suicide/Harm Yourself At This time? No   Have you Recently Had Thoughts About Hurting Someone Tonya Wilson? No Explanation: N/A   Have You Used Any Alcohol or Drugs in the Past 24 Hours? I drink occasionally on the weekends, maybe 2 or 3 per day. I smoke mariajuana most nights. How Long Ago Did You Use Drugs or Alcohol? Yesterday What Did You Use and How Much? Mariajuana, a few hits (maybe 3 or 4)   Do You Currently Have a Therapist/Psychiatrist? Yes Name of Therapist/Psychiatrist: Therapist, Tonya Wilson, LCSWA   Have You Been Recently Discharged From Any Office Practice or Programs? No Explanation of Discharge From Practice/Program: N/A                CCA Screening Triage Referral Assessment Type of Contact: In person Is this Initial or Reassessment? Reassessment Date Telepsych consult ordered in [facility]: 05/26/2023 Time Telepsych consult ordered in [facility]:     Patient Reported Information Reviewed? Information received from office Patient Left Without Being Seen? No Reason for Not Completing Assessment: N/A   Collateral Involvement: No   Does Patient Have a Court Appointed Legal Guardian? No Name and Contact of Legal Guardian: N/A If Minor and Not Living with Parent(s), Who has Custody? N/A Is CPS involved or ever been involved? N/A Is APS involved or ever been involved? No   Patient Determined To Be At Risk for Harm To Self or Others Based on Review of Patient Reported Information or Presenting Complaint? No Method: N/A Availability of Means: N/A Intent: N/A Notification Required: N/A Additional Information for Danger to Others Potential: N/A Additional Comments for Danger to Others Potential: N/A Are There Guns or Other Weapons in Your Home? N/A Types of Guns/Weapons: N/A Are These Weapons Safely Secured? N/A Who Could Verify You Are Able To Have These Secured: N/A Do You Have any Outstanding Charges, Pending Court Dates, Parole/Probation? No Contacted To Inform of Risk of Harm To Self or Others: No   Location of Assessment: In office, [facility]   Does Patient Present under Involuntary Commitment? No IVC Papers Initial File Date: N/A   [place] of Residence: [place]   Patient Currently Receiving the Following Services: Help with food  insecurity, grief counseling   Determination of Need: Moderate   Options For Referral: Local food pantries       CCA Biopsychosocial Intake/Chief Complaint:  Anxiety and depression Current Symptoms/Problems: High anxiety and moments  of feeling low    Patient Reported Schizophrenia/Schizoaffective Diagnosis in Past: No   Strengths: hard working, confident, easy going, funny, resilient, persistent, perseverance  Preferences: organization, stability Abilities: IT related jobs, dependable, responsible, loyal, determined   Type of Services Patient Feels are Needed: help with food, possible help with bills   Initial Clinical Notes/Concerns: Patient has high anxiety at times, it can be debilitating. Client generally pushes through but it can be tough.   Mental Health Symptoms Depression:  Yes  Duration of Depressive symptoms: Years  Mania:  No  Anxiety:   Yes  Psychosis:  No  Duration of Psychotic symptoms: No  Trauma:  Yes, specifically from the past.  Obsessions:  No  Compulsions:  No  Inattention:  At times if anxiety is really high.  Hyperactivity/Impulsivity:  No  Oppositional/Defiant Behaviors:  No  Emotional Irregularity:  Yes, has pent up anger that will come out when pushed.  Other Mood/Personality Symptoms:  No    Mental Status Exam Appearance and self-care  Stature:  5 foot 2 inches  Weight: 153 lbs.  Clothing:  Clean, well kept  Grooming:  Well groomed  Cosmetic use:  None  Posture/gait:  Normal range  Motor activity:  Normal range  Sensorium  Attention:  Has tendency to loose focus at times  Concentration:  Normal  Orientation:  A + O  Recall/memory:  Normal range  Affect and Mood  Affect:  Can be emotional and anxious at times  Mood:  Mostly a good disposition  Relating  Eye contact:  Normal range  Facial expression:  Normal range  Attitude toward examiner:  Pleasant  Thought and Language  Speech flow: Normal, well spoken  Thought content:  Normal range  Preoccupation:   Normal range  Hallucinations:  None  Organization:  Normal range  [employer] of Knowledge:  Normal range  Intelligence:  Normal range  Abstraction:  Normal range  Judgement:  Normal range  Reality  Testing:  Normal range  Insight:  Normal range  Decision Making:  Normal range  Social Functioning  Social Maturity:  Normal range  Social Judgement:  Normal range  Stress  Stressors:  Money, family, mom's death  Coping Ability:  Normal range  Skill Deficits:  Anger  Supports:  Family, cats, friends, [facility]      Religion: Christian   Leisure/Recreation:Working on getting the client out of the house to start hanging out with friends or being involved with things that might bring her joy. Client will visit family and run errands. She enjoys music.     Exercise/Diet: Client eats a healthy diet, she does not exercise regularly     CCA Employment/Education Employment/Work Situation: Client is employed full time. She has a job doing Consulting civil engineer.     Education: [employer] diploma       CCA Family/Childhood History Family and Relationship History: Client has trauma with family from the past. The relationship with her mother and sisters can be strained at times, but they always work it out. Recently. She had gotten much closer with her mom and one sister is her best friend until her mom passed in September. Client has had several romantic relationships in the past. There is a history of ups and downs, until  the downs outweigh the ups and the client feels the need to end them. The last relationship was very tough and that is what started her journey with therapy. She has a daughter who is not her biological daughter by blood but has always treated her like she is. She has raised her and only in the last year have they taken a break from one another. The daughter went back to live with her mother but quickly realized that the client was her "mother" and is coming back to live with her. They still talk they just live separate.     Childhood History:  Childhood was challenging at times. Client struggled with mental health in teen years because of this. Client has spent a lot of time working through that  to repair relationships with her family.    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 The client reports that she spent a lot of time working through that and did not drink for a long time. Client reports that she only drinks socially on the weekends, 2-3 at most. Client reports that she does smoke mariajuana most night, 3-4 hits at most to calm her nerves.         ASAM's:  Six Dimensions of Multidimensional Assessment   Dimension 1:  Acute Intoxication and/or Withdrawal Potential: None  Dimension 2:  Biomedical Conditions and Complications:  Her past drinking has caused problems with her body. She reports that she tries to make sure that she is taking the best care of herself that she can.  Dimension 3:  Emotional, Behavioral, or Cognitive Conditions and Complications:  None  Dimension 4:  Readiness to Change:  None  Dimension 5:  Relapse, Continued use, or Continued Problem Potential:  Continued use, but client feels this is where she is realistically and has no desire to go backwards.  Dimension 6:  Recovery/Living Environment:  N/A  ASAM Severity Score:    ASAM Recommended Level of Treatment:      Substance use Disorder (SUD)     Recommendations for Services/Supports/Treatments:Continue substance use assessment and weekly check ins.      DSM5 Diagnoses:     Patient Active Problem List    Diagnosis Date Noted   Vitamin D deficiency 01/01/2023   Microalbuminuria 01/01/2023   Insomnia 05/26/2021   Panic disorder 01/23/2021   Anxiety and depression 01/23/2021   Anemia     Chronic kidney disease     Pituitary adenoma (HCC) 05/28/2020   Prolactinoma (HCC) 02/26/2020   Malignant neoplasm of pituitary gland (HCC) 02/26/2020   Migraine with aura and with status migrainosus, not intractable 02/26/2020   Increased frequency of headaches 02/26/2020   Persistent proteinuria  02/26/2020   Premature infant with birthweight 1000-2499 grams 02/26/2020   Migraine without aura and without status migrainosus, not intractable 12/25/2019   Raynaud's phenomenon without gangrene 12/25/2019   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   Constipation 06/25/2019   Menstrual migraine without status migrainosus, not intractable 06/25/2019   Hypertension     Infertility, female     Asthma     Seasonal allergies     Uterine leiomyoma 07/25/2016   Prematurity 07/25/1981      Patient Centered Plan: Patient is on the following Treatment Plan(s):  Anxiety, depression, and occasional panic disorder     Referrals to Alternative Service(s): Referred to Alternative  Service(s):   Place:   Date:   Time:    Referred to Alternative Service(s):   Place:   Date:   Time:    Referred to Alternative Service(s):   Place:   Date:   Time:    Referred to Alternative Service(s):   Place:   Date:   Time:        Collaboration of Care:  Patient/Guardian was advised Release of Information must be obtained prior to any record release in order to collaborate their care with an outside provider. Patient/Guardian was advised if they have not already done so to contact the registration department to sign all necessary forms in order for [place] to release information regarding their care.    Consent: Patient/Guardian gives verbal consent for treatment and assignment of benefits for services provided during this visit. Patient/Guardian expressed understanding and agreed to proceed.    Letta Moynahan T [place], [place]

## 2023-07-03 ENCOUNTER — Telehealth: Payer: Self-pay

## 2023-07-03 NOTE — Telephone Encounter (Signed)
Patient called to report that she has been experiencing palpitation for the past 2 weeks. Patient suspects it could be due to stress. Palpitations occur when she has having a panic attack. Patient also has nose bleeds in the mornings, but suspects it could be due to housing situation. Would like to know if she needs to be seen.

## 2023-07-04 NOTE — Progress Notes (Unsigned)
DAP Note   Provider/Clinician's Name: Letta Moynahan Darreld Mclean, [place]   Client Name: Tonya Wilson   Date of Service: 06/05/2023    Duration: 60 mins.         Data: Client reports, "I am doing a little better. On top of that finding a funeral home that they can afford has also been a challenge. Client reports stress and anxiety are still at a 10 with everything that has happened.   Assessment: The client is doing a little better. She is trying her best to work through the grief while continuing on with life. Her sister is her biggest support system and they talk all the time. They are both struggling so it is good that they have each other. The apartment situation is still causing a lot of problems for her. It has needed so much work and attention. We are starting to work through the grieving process but it has been difficult because she is dealing with so much stuff in life and that has made it hard for her to have the proper time to work though all of it.    Plan: Weekly sessions with a goal of 4 per month. Client will implement a moment of mindfulness at the start and end to each day to clear mind. Client will practice box breathing when anxiety and stress are running high. Client reports anxiety and stress are currently at an 10 and would like to reduce this to a 3 with the use of interventions. Client will start writing down positive affirmations in a book to help boost self confidence. Begin working through acceptance, processing, with the goal of adjusting to grief.

## 2023-07-06 ENCOUNTER — Ambulatory Visit: Payer: No Typology Code available for payment source | Admitting: Internal Medicine

## 2023-07-06 ENCOUNTER — Encounter: Payer: Self-pay | Admitting: Internal Medicine

## 2023-07-06 ENCOUNTER — Ambulatory Visit: Payer: No Typology Code available for payment source | Admitting: Psychology

## 2023-07-06 VITALS — BP 130/88 | HR 76 | Resp 16 | Ht 62.0 in | Wt 157.0 lb

## 2023-07-06 DIAGNOSIS — N183 Chronic kidney disease, stage 3 unspecified: Secondary | ICD-10-CM | POA: Diagnosis not present

## 2023-07-06 DIAGNOSIS — Z9109 Other allergy status, other than to drugs and biological substances: Secondary | ICD-10-CM

## 2023-07-06 DIAGNOSIS — F32A Depression, unspecified: Secondary | ICD-10-CM

## 2023-07-06 DIAGNOSIS — F419 Anxiety disorder, unspecified: Secondary | ICD-10-CM

## 2023-07-06 DIAGNOSIS — Z77120 Contact with and (suspected) exposure to mold (toxic): Secondary | ICD-10-CM | POA: Diagnosis not present

## 2023-07-06 DIAGNOSIS — I151 Hypertension secondary to other renal disorders: Secondary | ICD-10-CM

## 2023-07-06 DIAGNOSIS — R7989 Other specified abnormal findings of blood chemistry: Secondary | ICD-10-CM

## 2023-07-06 DIAGNOSIS — J0140 Acute pansinusitis, unspecified: Secondary | ICD-10-CM | POA: Diagnosis not present

## 2023-07-06 DIAGNOSIS — F410 Panic disorder [episodic paroxysmal anxiety] without agoraphobia: Secondary | ICD-10-CM

## 2023-07-06 DIAGNOSIS — R002 Palpitations: Secondary | ICD-10-CM | POA: Diagnosis not present

## 2023-07-06 MED ORDER — CABERGOLINE 0.5 MG PO TABS: 0.5000 mg | ORAL_TABLET | ORAL | 3 refills | Status: DC

## 2023-07-06 MED ORDER — AMOXICILLIN-POT CLAVULANATE 875-125 MG PO TABS: 1.0000 | ORAL_TABLET | Freq: Two times a day (BID) | ORAL | 0 refills | Status: DC

## 2023-07-06 MED ORDER — FLUCONAZOLE 150 MG PO TABS: ORAL_TABLET | ORAL | 0 refills | Status: DC

## 2023-07-06 NOTE — Telephone Encounter (Signed)
Patient has been seen by PCP.

## 2023-07-06 NOTE — Progress Notes (Signed)
Subjective:    Patient ID: Tonya Wilson, female   DOB: 14-Jul-1981, 42 y.o.   MRN: [id number]   HPI   Palpitations:  For 2 weeks.  Describes a delayed type beat, which feels like a hard beat followed by 3 fast beats and feels short of breath.  Can get light headed if recurrent.  On and off throughout each day since started.  No caffeine intake or chocolate.  States could be having short lived panic attacks.    2.  Asthma and allergies, cough congestion:    Mold finally found in apartment from leak with water heater so bad that the floor beneath fell through to her friend's apartment below.  She is having nose bleeds and burning in nose.  She moves out of this apartment next week.  She has a dehumidifier going as well as an Product manager.  Stopped using the flonase with nose bleeds.  Vacuuming regularly.   Also taking Zyrtec daily  Cough and congestion:  Not able to afford Breo ellipta.  Coughing and congestion for some time with mold in home.  Using Albuterol twice daily.  + posterior pharyngeal drainage and sinus pressure.  Mucous yellow and from nose, streaked with blood.   3.  She is not following with specialists--too may things going on with mold in her apartment.    Needs cabergoline refilled  4.  CKD/microalbuminuria:  Creatinine remains about the same at 1.50.  She has not been able to afford Jardiance.  None of the other SGL2Is are covered any better.  Microalbuminuria remains in mid 300s.  5.  Hypertension:  not missing meds and tolerating fine.  Current Meds  Medication Sig   acetaminophen (TYLENOL) 500 MG tablet Take 1,000 mg by mouth every 6 (six) hours as needed. For pain   albuterol (VENTOLIN HFA) 108 (90 Base) MCG/ACT inhaler Inhale 1-2 puffs into the lungs every 6 (six) hours as needed for wheezing or shortness of breath.   amLODipine (NORVASC) 5 MG tablet TAKE 1 TABLET(5 MG) BY MOUTH DAILY   cabergoline (DOSTINEX) 0.5 MG tablet Take 1 tablet (0.5 mg total) by  mouth 2 (two) times a week.   cetirizine (ZYRTEC) 10 MG tablet Take 1 tablet (10 mg total) by mouth daily.   Cholecalciferol (VITAMIN D3) 25 MCG (1000 UT) CAPS Take 1 capsule (1,000 Units total) by mouth daily.   clobetasol cream (TEMOVATE) 0.05 % Apply 1 application topically 2 (two) times daily.   clonazePAM (KLONOPIN) 1 MG tablet 1/2 to 1 tab by mouth twice daily only as needed for anxiety   cloNIDine (CATAPRES) 0.1 MG tablet TAKE 2 TABLETS BY MOUTH THREE TIMES DAILY   DULoxetine (CYMBALTA) 60 MG capsule Take 1 capsule (60 mg total) by mouth daily.   fluticasone (FLONASE) 50 MCG/ACT nasal spray Place 2 sprays into both nostrils daily.   losartan (COZAAR) 25 MG tablet TAKE 1 TABLET BY MOUTH DAILY   ondansetron (ZOFRAN) 4 MG tablet Take 1 tablet (4 mg total) by mouth every 8 (eight) hours as needed for nausea or vomiting.   Rimegepant Sulfate (NURTEC) 75 MG TBDP Take 1 tablet (75 mg total) by mouth daily as needed (take for abortive therapy of migraine, no more than 1 tablet in 24 hours or 10 per month).   sodium bicarbonate 650 MG tablet Take 650 mg by mouth 2 (two) times daily.   traZODone (DESYREL) 50 MG tablet 1 to 2 tabs BY MOUTH AT BEDTIME (Patient taking differently: as  needed. 1 to 2 tabs BY MOUTH AT BEDTIME)   Allergies  Allergen Reactions   Other Other (See Comments)    NO NSAIDS 2/2 CKD PER PT   Flagyl [Metronidazole] Rash     Review of Systems    Objective:   BP 130/88 (BP Location: Left Arm, Patient Position: Sitting, Cuff Size: Normal)   Pulse 76   Resp 16   Ht 5\' 2"  (1.575 m)   Wt 157 lb (71.2 kg)   LMP 06/26/2023   BMI 28.72 kg/m   Physical Exam Upper airway congestion HEENT:  PERRL, EOMI, conjunctivae without injection.  TMs without erythema.  Posterior pharynx with cobbling  Nasal mucosa, particularly right swollen and erythematous.  Tender over maxillary sinus area bilaterally.   Neck: Supple, No adenopathy.  No thyromegaly Chest:  CTA with good air  movement.   CV:  RRR with normal S1 and S2, No S3, S4 or murmur.  Radial pulses are difficult to feel consistently.  Listened to HS for prolonged period of time and had patient raise her hand whenever she felt palpitations.  Raised hand repeatedly and HR was regular and normal rate throughout.   LE:  No edema.   Assessment & Plan   Palpitations:  electrolytes fine.  No findings with exam while having symptoms.  She did not want ECG today, regardless, do not believe she is having an irregular rhythm.  Call if worsening symptoms.  2.  Sinusitis:  Augmentin 875/125 mg twice daily for 10 days course.  Fluconazole 150 mg if needed after completing course for yeast infection.  Encouraged her to get a neti pot to use once daily before use of flonase.  May also use KY jelly in nostril for moisture.    3.  Allergies/asthma and mold exposure:  insurance is now saying Wixela should be covered for maintenance therapy for asthma.  Rx sent in.  To brush teeth and tongue after use.   She needs to get out of current living situation and into and healthier apartment.    4.  Hypertension:  controlled  5.  CKD with microalbuminuria:  Jardiance and similar meds not adequately covered.  She will call if insurance change and will send in again.    6.  Low Vitamin D:  needs to get outside and maintain supplementation.

## 2023-07-06 NOTE — Progress Notes (Signed)
Treatment Plan The Counseling Treatment Plan is a dynamic document that is regularly reviewed and updated as the client's needs and circumstances change over the course of counseling. The plan serves as a roadmap for the counseling process and helps ensure that the client and counselor work together toward common goals.  Section I. Client Information  Client name:  Tonya Wilson  Established Date: 09/2022 Therapist name: Letta Moynahan Darreld Mclean, LCSWA Section II. Presenting Concerns   Brief Description of Concerns Client has a lot of emotional irregularity. Client has uncontrollable anger at times. She has worked very hard to use interventions to control the anger. Client suffers from GAD and mild depression. Recently the client has suffered a sudden loss with the death of her mother and is having a very hard time coping. Goals have been adjusted.   Goals for Counseling 1) Developing coping skills 2) Reducing symptoms of depression and GAD 3) Work on emotional regulation and improve coping skills 4) Manage the stages of grief      Section III. Action Plan          Summary of Strategies/ Interventions       Roles and Responsibilities       Timeline for Completion  1) Attend 4 sessions per month. The client will attend every week because it is very valuable for her mental health, adapting to schedules.  Client- makes time for sessions, adjust to work schedule Therapist- check in and flexibility as allows Evaluate once a month    2) Decrease levels of depression and GAD from 10 to 3. Practice mindfulness techniques learned daily in therapy, in addition to box breathing. Also, the use of other interventions should help this number with time. Client- committed to 5 mins per day. Therapist- teach techniques and recenter each session 3 months, check in weekly  3) Improve emotional deregulation and coping skills by keeping a daily schedule, develop problem solving approaches, practice  positive thinking, and avoid substances. Client- following through with schedules and interventions set by client Therapist-helping guide schedule and plan Two weeks, evaluate  outcomes  4) Spend 10 minutes a day writing down thoughts and feelings to help better understand emotional patterns. Client- spend 10 mins. a day writing  Two weeks and evaluate  5) Manage the stages of grief  Denial - feeling numb, not believing the event has happened  Anger- at the loss of a planned future or the unfairness of life  Bargaining- going over what could have been  Depression- intense sadness and longing for the ones lost  Acceptance- easing of the pain and learning to live again The stages are not always linear. Therapy will guide individuals to understand how their own experiences fit into the model and how to manage their own reactions.  Client-with time, process and accept each stage in real time. Use interventions to cope. Therapist- help to process each stage, work to help client accept         Additional notes: Reviewed: current date 06/19/2023 Ongoing, Achieved, Revised

## 2023-07-06 NOTE — Patient Instructions (Signed)
Neti pot daily with distilled water--body temp  Call when you have new insurance

## 2023-07-07 ENCOUNTER — Telehealth: Payer: Self-pay

## 2023-07-07 DIAGNOSIS — Z9109 Other allergy status, other than to drugs and biological substances: Secondary | ICD-10-CM | POA: Insufficient documentation

## 2023-07-07 DIAGNOSIS — R002 Palpitations: Secondary | ICD-10-CM | POA: Insufficient documentation

## 2023-07-07 DIAGNOSIS — Z77120 Contact with and (suspected) exposure to mold (toxic): Secondary | ICD-10-CM | POA: Insufficient documentation

## 2023-07-07 MED ORDER — FLUTICASONE-SALMETEROL 100-50 MCG/ACT IN AEPB: 1.0000 | INHALATION_SPRAY | Freq: Two times a day (BID) | RESPIRATORY_TRACT | 11 refills | Status: DC

## 2023-07-07 NOTE — Telephone Encounter (Signed)
Patients insurance told me that [place] is an alternative to breo ellipta that is covered with $0 co-pay

## 2023-07-14 ENCOUNTER — Ambulatory Visit (INDEPENDENT_AMBULATORY_CARE_PROVIDER_SITE_OTHER): Payer: No Typology Code available for payment source | Admitting: Psychology

## 2023-07-14 DIAGNOSIS — F419 Anxiety disorder, unspecified: Secondary | ICD-10-CM | POA: Diagnosis not present

## 2023-07-14 DIAGNOSIS — F32A Depression, unspecified: Secondary | ICD-10-CM | POA: Diagnosis not present

## 2023-07-14 DIAGNOSIS — F410 Panic disorder [episodic paroxysmal anxiety] without agoraphobia: Secondary | ICD-10-CM

## 2023-07-14 NOTE — Telephone Encounter (Signed)
Patient has continued to take Vitamin D3 1000 units daily. Has been scheduled for repeat labs.

## 2023-07-24 ENCOUNTER — Encounter: Payer: Self-pay | Admitting: Internal Medicine

## 2023-07-24 ENCOUNTER — Ambulatory Visit (INDEPENDENT_AMBULATORY_CARE_PROVIDER_SITE_OTHER): Payer: No Typology Code available for payment source | Admitting: Internal Medicine

## 2023-07-24 VITALS — BP 120/82 | HR 72 | Resp 16 | Ht 62.0 in | Wt 154.0 lb

## 2023-07-24 DIAGNOSIS — R300 Dysuria: Secondary | ICD-10-CM | POA: Diagnosis not present

## 2023-07-24 DIAGNOSIS — B3731 Acute candidiasis of vulva and vagina: Secondary | ICD-10-CM | POA: Diagnosis not present

## 2023-07-24 LAB — POCT URINALYSIS DIPSTICK
Bilirubin, UA: NEGATIVE
Blood, UA: NEGATIVE
Glucose, UA: NEGATIVE
Ketones, UA: NEGATIVE
Leukocytes, UA: NEGATIVE
Nitrite, UA: NEGATIVE
Protein, UA: POSITIVE — AB
Spec Grav, UA: 1.025 (ref 1.010–1.025)
Urobilinogen, UA: 0.2 U/dL
pH, UA: 6 (ref 5.0–8.0)

## 2023-07-24 MED ORDER — FLUCONAZOLE 150 MG PO TABS: ORAL_TABLET | ORAL | 0 refills | Status: DC

## 2023-07-24 MED ORDER — DAPAGLIFLOZIN PROPANEDIOL 5 MG PO TABS: ORAL_TABLET | ORAL | 11 refills | Status: DC

## 2023-07-24 NOTE — Progress Notes (Signed)
Subjective:    Patient ID: Tonya Wilson, female   DOB: [birth year], 42 y.o.   MRN: [id number]   HPI   CKD with microalbuminuria:  She can now get Farxiga for $109.70 every 90 days, which she can afford.    2.  Sinusitis and Asthma:  finally moved into a different apartment without mold and feeling much better.  Respiratory symptoms have resolved.  Took 2.5 days of Augmentin, but had to stop due to burning and white adherent   3.  Urinating frequently and burns on the outside only.  See above.  She did take the Fluconazole, which may have helped a bit.  Topical vaginal yeast preparations burn.    Current Meds  Medication Sig   acetaminophen (TYLENOL) 500 MG tablet Take 1,000 mg by mouth every 6 (six) hours as needed. For pain   albuterol (VENTOLIN HFA) 108 (90 Base) MCG/ACT inhaler Inhale 1-2 puffs into the lungs every 6 (six) hours as needed for wheezing or shortness of breath.   amLODipine (NORVASC) 5 MG tablet TAKE 1 TABLET(5 MG) BY MOUTH DAILY   cabergoline (DOSTINEX) 0.5 MG tablet Take 1 tablet (0.5 mg total) by mouth 2 (two) times a week.   cetirizine (ZYRTEC) 10 MG tablet Take 1 tablet (10 mg total) by mouth daily.   Cholecalciferol (VITAMIN D3) 25 MCG (1000 UT) CAPS Take 1 capsule (1,000 Units total) by mouth daily.   clobetasol cream (TEMOVATE) 0.05 % Apply 1 application topically 2 (two) times daily.   cloNIDine (CATAPRES) 0.1 MG tablet TAKE 2 TABLETS BY MOUTH THREE TIMES DAILY   DULoxetine (CYMBALTA) 60 MG capsule Take 1 capsule (60 mg total) by mouth daily.   fluticasone (FLONASE) 50 MCG/ACT nasal spray Place 2 sprays into both nostrils daily.   losartan (COZAAR) 25 MG tablet TAKE 1 TABLET BY MOUTH DAILY   ondansetron (ZOFRAN) 4 MG tablet Take 1 tablet (4 mg total) by mouth every 8 (eight) hours as needed for nausea or vomiting.   Rimegepant Sulfate (NURTEC) 75 MG TBDP Take 1 tablet (75 mg total) by mouth daily as needed (take for abortive therapy of migraine, no more  than 1 tablet in 24 hours or 10 per month).   Allergies  Allergen Reactions   Other Other (See Comments)    NO NSAIDS 2/2 CKD PER PT   Flagyl [Metronidazole] Rash     Review of Systems    Objective:   BP 120/82 (BP Location: Right Arm, Patient Position: Sitting, Cuff Size: Normal)   Pulse 72   Resp 16   Ht 5\' 2"  (1.575 m)   Wt 154 lb (69.9 kg)   LMP 06/26/2023   BMI 28.17 kg/m   Physical Exam NAD HEENT: PERRL, EOMI, TMs pearly gray, nasal mucosa mildly erythematous but without swelling.  No discharge.  Mild cobbling of posterior pharynx.  NT over frontal and maxillary sinuses. Neck:  Supple, No adenopathy Chest:  CTA CV:  RRR without murmur or rub.  Radial pulses normal and equal Abd:  S, NT, No HSM or mass.  No flank or suprapubic tenderness GU:  mild inflammation of external vulvar area with adherent white exudate on mucosal surface of labia/clitoris.  Did not tolerate small speculum.     Assessment & Plan   Exposure to mold in previous apartment and seemed to have secondary bacterial sinusitis, but symptoms abated after a week of being in new apartment without mold.  Did not tolerate Augmentin with vaginal irritation after use  of 2.5 days, so stopped  2.  Yeast vaginitis:  Fluconazole 150 mg once daily for 3 days.  Sitz baths with oatmeal.  Call if does not resolve.

## 2023-07-24 NOTE — Patient Instructions (Signed)
Aveeno oatmeal bath--luke warm for 20 minutes twice daily.

## 2023-07-26 ENCOUNTER — Other Ambulatory Visit: Payer: No Typology Code available for payment source | Admitting: Psychology

## 2023-08-04 ENCOUNTER — Other Ambulatory Visit: Payer: No Typology Code available for payment source | Admitting: Psychology

## 2023-08-04 ENCOUNTER — Telehealth (INDEPENDENT_AMBULATORY_CARE_PROVIDER_SITE_OTHER): Payer: No Typology Code available for payment source | Admitting: Psychology

## 2023-08-04 DIAGNOSIS — F419 Anxiety disorder, unspecified: Secondary | ICD-10-CM | POA: Diagnosis not present

## 2023-08-04 DIAGNOSIS — F32A Depression, unspecified: Secondary | ICD-10-CM

## 2023-08-04 DIAGNOSIS — F410 Panic disorder [episodic paroxysmal anxiety] without agoraphobia: Secondary | ICD-10-CM | POA: Diagnosis not present

## 2023-08-07 ENCOUNTER — Ambulatory Visit (INDEPENDENT_AMBULATORY_CARE_PROVIDER_SITE_OTHER): Payer: No Typology Code available for payment source | Admitting: Psychology

## 2023-08-07 DIAGNOSIS — F419 Anxiety disorder, unspecified: Secondary | ICD-10-CM | POA: Diagnosis not present

## 2023-08-07 DIAGNOSIS — F32A Depression, unspecified: Secondary | ICD-10-CM | POA: Diagnosis not present

## 2023-08-07 DIAGNOSIS — F410 Panic disorder [episodic paroxysmal anxiety] without agoraphobia: Secondary | ICD-10-CM | POA: Diagnosis not present

## 2023-08-09 ENCOUNTER — Other Ambulatory Visit: Payer: Self-pay | Admitting: Internal Medicine

## 2023-08-14 NOTE — Progress Notes (Signed)
            DAP Note   Provider/Clinician's Name: Letta Moynahan Darreld Mclean, [place]   Client Name: Tonya Wilson   Date of Service: 07/06/2023    Duration: 60 mins.         Data: Client reports, "I am going to look at a new place when we get done here. I really hope this is the one. They are showing me the exact apartment that I would be renting. I am not playing that game again." The client stated that she felt hopeful for the first time in a long time.  Client reports her anxiety level is down to a 7 this week and would like to continue working to get that down to a 3 with the use of interventions.   Assessment: The client appeared upbeat and positive. She will still be a bit unsettled until she secures a place. We did discuss how she was feeling with everything that has happened. One concern is that with everything else going on she really has not been able to feel the grief with her mother. We talked about how she needs to be careful of this and when or if things do come out she needs to be able to have the time and space to deal with it. As soon as she gets this apartment settled we will need to evaluate and see where she is at.    Plan: Weekly sessions with a goal of 4 times per month. Client will implement a moment of mindfulness at the start and end to each day to clear mind. Client will practice box breathing when anxiety is running high. Client will start writing down positive affirmations in a book to help boost self confidence. We have started working through the 5 stages of grief, with the goal of adjusting to the stage that the client is currently in.

## 2023-08-17 ENCOUNTER — Ambulatory Visit: Payer: No Typology Code available for payment source | Admitting: Psychology

## 2023-08-17 DIAGNOSIS — F410 Panic disorder [episodic paroxysmal anxiety] without agoraphobia: Secondary | ICD-10-CM | POA: Diagnosis not present

## 2023-08-17 DIAGNOSIS — F32A Depression, unspecified: Secondary | ICD-10-CM | POA: Diagnosis not present

## 2023-08-17 DIAGNOSIS — F419 Anxiety disorder, unspecified: Secondary | ICD-10-CM | POA: Diagnosis not present

## 2023-08-18 ENCOUNTER — Other Ambulatory Visit: Payer: Self-pay

## 2023-08-18 MED ORDER — AMLODIPINE BESYLATE 5 MG PO TABS: 5.0000 mg | ORAL_TABLET | Freq: Every day | ORAL | 3 refills | Status: DC

## 2023-08-21 NOTE — Progress Notes (Signed)
            DAP Note   Provider/Clinician's Name: Letta Moynahan Darreld Mclean, [place]   Client Name: Tonya Wilson   Date of Service: 07/14/2023    Duration: 60 mins.         Data: Client reports, "I am so excited to be moving. I was able to see the actual apartment that I am moving into and it really does feel like this is finally going to be my fresh start. I just have to wait 2 more weeks." Client also reported that the apartment complex has left her alone for now. They are telling her that she owes the money but as she reported she just needs to get out and then she can deal with that later.  Client reports her anxiety level is down to a 5 this week and would like to continue working to get that down to a 3 with the use of interventions.   Assessment: The client appeared much more upbeat and positive. She is so hopeful about moving into a new place. This is finally her fresh start. She is aware that she will most likely come into a lot of emotions surrounding everything that she has been through once she is settled. It might actually be good for her to have some quiet. Things have been so chaotic for her for so long, that some normalcy could be welcomed. She will just need to make sure that she is taking care of herself mentally.   Plan: Attend weekly sessions with a goal of 4 times per month. Client will implement a moment of mindfulness at the start and end of each day to clear mind. Client will practice box breathing when anxiety is running high. Client will start writing down positive affirmations in a book to help boost self confidence. We have started working through the 5 stages of grief, with the goal of adjusting to the stage that the client is currently in.

## 2023-08-25 ENCOUNTER — Ambulatory Visit: Payer: No Typology Code available for payment source | Admitting: Psychology

## 2023-08-25 DIAGNOSIS — F410 Panic disorder [episodic paroxysmal anxiety] without agoraphobia: Secondary | ICD-10-CM

## 2023-08-25 DIAGNOSIS — F32A Depression, unspecified: Secondary | ICD-10-CM

## 2023-08-25 DIAGNOSIS — F419 Anxiety disorder, unspecified: Secondary | ICD-10-CM

## 2023-09-08 ENCOUNTER — Telehealth: Payer: No Typology Code available for payment source | Admitting: Psychology

## 2023-09-08 ENCOUNTER — Other Ambulatory Visit: Payer: No Typology Code available for payment source | Admitting: Psychology

## 2023-09-08 DIAGNOSIS — F410 Panic disorder [episodic paroxysmal anxiety] without agoraphobia: Secondary | ICD-10-CM | POA: Diagnosis not present

## 2023-09-08 DIAGNOSIS — F411 Generalized anxiety disorder: Secondary | ICD-10-CM | POA: Diagnosis not present

## 2023-09-08 DIAGNOSIS — F341 Dysthymic disorder: Secondary | ICD-10-CM

## 2023-09-15 ENCOUNTER — Telehealth: Payer: No Typology Code available for payment source | Admitting: Psychology

## 2023-09-22 ENCOUNTER — Telehealth (INDEPENDENT_AMBULATORY_CARE_PROVIDER_SITE_OTHER): Payer: No Typology Code available for payment source | Admitting: Psychology

## 2023-09-22 DIAGNOSIS — F411 Generalized anxiety disorder: Secondary | ICD-10-CM | POA: Diagnosis not present

## 2023-09-22 DIAGNOSIS — F410 Panic disorder [episodic paroxysmal anxiety] without agoraphobia: Secondary | ICD-10-CM | POA: Diagnosis not present

## 2023-09-22 DIAGNOSIS — F341 Dysthymic disorder: Secondary | ICD-10-CM | POA: Diagnosis not present

## 2023-09-27 ENCOUNTER — Telehealth (INDEPENDENT_AMBULATORY_CARE_PROVIDER_SITE_OTHER): Admitting: Psychology

## 2023-09-27 DIAGNOSIS — F410 Panic disorder [episodic paroxysmal anxiety] without agoraphobia: Secondary | ICD-10-CM | POA: Diagnosis not present

## 2023-09-27 DIAGNOSIS — F411 Generalized anxiety disorder: Secondary | ICD-10-CM | POA: Diagnosis not present

## 2023-09-27 DIAGNOSIS — F341 Dysthymic disorder: Secondary | ICD-10-CM

## 2023-10-02 NOTE — Progress Notes (Signed)
 Comprehensive Clinical Assessment (CCA) Note   Tonya Wilson [id number] 08/04/2023 Chief Complaint: Anxiety and Depression, Panic Disorder   Visit Diagnosis: Anxiety and Depression, panic disorder     CCA Screening, Triage and Referral (STR)   Patient Reported Information How did you hear about [place]? Client has been a patient at [facility] since 2019, doctor recommended Referral name: Felizardo Hoffmann Referral phone number: [phone]   Whom do you see for routine medical problems? Felizardo Hoffmann Practice/Facility Name: [facility] Practice/Facility Phone Number: [phone] Name of Contact: Felizardo Hoffmann Contact Number: [phone] Contact Fax Number: [phone] Prescriber Name: [facility] Prescriber Address (if known): [address]   What Is the Reason for Your Visit/Call Today? Therapy How Long Has This Been Causing You Problems? I have struggled with anxiety and depression for years. What Do You Feel Would Help You the Most Today? Weekly therapy sessions   Have You Recently Been in Any Inpatient Treatment (Hospital/Detox/Crisis Center/28-Day Program)? No Name/Location of Program/Hospital:N/A How Long Were You There? N/A When Were You Discharged? N/A   Have You Ever Received Services From [employer] [facility] Before? Yes Who Do You See at [facility]? Referrals and emergency room visits   Have You Recently Had Any Thoughts About Hurting Yourself? No, never Are You Planning to Commit Suicide/Harm Yourself At This time? No   Have you Recently Had Thoughts About Hurting Someone [name]? No Explanation: N/A   Have You Used Any Alcohol or Drugs in the Past 24 Hours? I drink occasionally on the weekends, maybe 2 or 3 per day. I smoke mariajuana most nights. How Long Ago Did You Use Drugs or Alcohol? Yesterday What Did You Use and How Much? Mariajuana, a few hits (maybe 3 or 4)   Do You Currently Have a Therapist/Psychiatrist? Yes Name of Therapist/Psychiatrist: Therapist, Roslynn Amble, LCSWA   Have You Been Recently Discharged From Any Office Practice or Programs? No Explanation of Discharge From Practice/Program: N/A                CCA Screening Triage Referral Assessment Type of Contact: In person Is this Initial or Reassessment? Reassessment Date Telepsych consult ordered in [facility]: 08/04/2023 Time Telepsych consult ordered in [facility]:     Patient Reported Information Reviewed? Information received from office Patient Left Without Being Seen? No Reason for Not Completing Assessment: N/A   Collateral Involvement: No   Does Patient Have a Court Appointed Legal Guardian? No Name and Contact of Legal Guardian: N/A If Minor and Not Living with Parent(s), Who has Custody? N/A Is CPS involved or ever been involved? N/A Is APS involved or ever been involved? No   Patient Determined To Be At Risk for Harm To Self or Others Based on Review of Patient Reported Information or Presenting Complaint? No Method: N/A Availability of Means: N/A Intent: N/A Notification Required: N/A Additional Information for Danger to Others Potential: N/A Additional Comments for Danger to Others Potential: N/A Are There Guns or Other Weapons in Your Home? N/A Types of Guns/Weapons: N/A Are These Weapons Safely Secured? N/A Who Could Verify You Are Able To Have These Secured: N/A Do You Have any Outstanding Charges, Pending Court Dates, Parole/Probation? No Contacted To Inform of Risk of Harm To Self or Others: No   Location of Assessment: In office, [facility]   Does Patient Present under Involuntary Commitment? No IVC Papers Initial File Date: N/A   [place] of Residence: [place]   Patient Currently Receiving the Following Services: Help with food  insecurity, grief counseling   Determination of Need: Moderate   Options For Referral: Local food pantries       CCA Biopsychosocial Intake/Chief Complaint:  Anxiety and depression Current Symptoms/Problems: High  anxiety and moments of feeling low    Patient Reported Schizophrenia/Schizoaffective Diagnosis in Past: No   Strengths: hard working, confident, easy going, funny, resilient, persistent, perseverance  Preferences: organization, stability Abilities: IT related jobs, dependable, responsible, loyal, determined   Type of Services Patient Feels are Needed: help with food, possible help with bills   Initial Clinical Notes/Concerns: Patient has high anxiety at times, it can be debilitating. Client generally pushes through but it can be tough.   Mental Health Symptoms Depression:  Yes  Duration of Depressive symptoms: Years  Mania:  No  Anxiety:   Yes  Psychosis:  No  Duration of Psychotic symptoms: No  Trauma:  Yes, specifically from the past.  Obsessions:  No  Compulsions:  No  Inattention:  At times if anxiety is really high.  Hyperactivity/Impulsivity:  No  Oppositional/Defiant Behaviors:  No  Emotional Irregularity:  Yes, has pent up anger that will come out when pushed.  Other Mood/Personality Symptoms:  No    Mental Status Exam Appearance and self-care  Stature:  5 foot 2 inches  Weight: 153 lbs.  Clothing:  Clean, well kept  Grooming:  Well groomed  Cosmetic use:  None  Posture/gait:  Normal range  Motor activity:  Normal range  Sensorium  Attention:  Has tendency to loose focus at times  Concentration:  Normal  Orientation:  A + O  Recall/memory:  Normal range  Affect and Mood  Affect:  Can be emotional and anxious at times  Mood:  Mostly a good disposition  Relating  Eye contact:  Normal range  Facial expression:  Normal range  Attitude toward examiner:  Pleasant  Thought and Language  Speech flow: Normal, well spoken  Thought content:  Normal range  Preoccupation:   Normal range  Hallucinations:  None  Organization:  Normal range  [employer] of Knowledge:  Normal range  Intelligence:  Normal range  Abstraction:  Normal range  Judgement:   Normal range  Reality Testing:  Normal range  Insight:  Normal range  Decision Making:  Normal range  Social Functioning  Social Maturity:  Normal range  Social Judgement:  Normal range  Stress  Stressors:  Money, family, mom's death  Coping Ability:  Normal range  Skill Deficits:  Anger  Supports:  Family, cats, friends, [facility]      Religion: Christian   Leisure/Recreation:Working on getting the client out of the house to start hanging out with friends or being involved with things that might bring her joy. Client will visit family and run errands. She enjoys music.     Exercise/Diet: Client eats a healthy diet, she does not exercise regularly     CCA Employment/Education Employment/Work Situation: Client is employed full time. She has a job doing Consulting civil engineer.     Education: [employer] diploma       CCA Family/Childhood History Family and Relationship History: Client has trauma with family from the past. The relationship with her mother and sisters can be strained at times, but they always work it out. Recently. She had gotten much closer with her mom and one sister is her best friend until her mom passed in September. Client has had several romantic relationships in the past. There is a history of ups and downs, until  the downs outweigh the ups and the client feels the need to end them. The last relationship was very tough and that is what started her journey with therapy. She has a daughter who is not her biological daughter by blood but has always treated her like she is. She has raised her and only in the last year have they taken a break from one another. The daughter went back to live with her mother but quickly realized that the client was her "mother" and is coming back to live with her. They still talk they just live separate.     Childhood History:  Childhood was challenging at times. Client struggled with mental health in teen years because of this. Client has spent a lot of  time working through that to repair relationships with her family.    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 The client reports that she spent a lot of time working through that and did not drink for a long time. Client reports that she only drinks socially on the weekends, 2-3 at most. Client reports that she does smoke mariajuana most night, 3-4 hits at most to calm her nerves.         ASAM's:  Six Dimensions of Multidimensional Assessment   Dimension 1:  Acute Intoxication and/or Withdrawal Potential: None  Dimension 2:  Biomedical Conditions and Complications:  Her past drinking has caused problems with her body. She reports that she tries to make sure that she is taking the best care of herself that she can.  Dimension 3:  Emotional, Behavioral, or Cognitive Conditions and Complications:  None  Dimension 4:  Readiness to Change:  None  Dimension 5:  Relapse, Continued use, or Continued Problem Potential:  Continued use, but client feels this is where she is realistically and has no desire to go backwards.  Dimension 6:  Recovery/Living Environment:  N/A  ASAM Severity Score:    ASAM Recommended Level of Treatment:      Substance use Disorder (SUD)     Recommendations for Services/Supports/Treatments:Continue substance use assessment and weekly check ins.      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   Premature infant with birthweight 1000-2499 grams 02/26/2020   Migraine without aura and without status migrainosus, not intractable 12/25/2019   Raynaud's phenomenon without gangrene 12/25/2019   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   Constipation 06/25/2019   Menstrual migraine without status migrainosus, not intractable 06/25/2019   Hypertension     Infertility, female     Asthma     Seasonal allergies     Uterine leiomyoma 07/25/2016   Prematurity 07/25/1981      Patient Centered Plan: Patient is on the following Treatment Plan(s):  Anxiety, depression, and occasional panic disorder     Referrals to Alternative Service(s):  Referred to Alternative Service(s):  [employer] [facility] Place:  [facility] Date:  01/10/205 Time:    Referred to Alternative Service(s):   Place:   Date:   Time:    Referred to Alternative Service(s):   Place:   Date:   Time:    Referred to Alternative Service(s):   Place:   Date:   Time:        Collaboration of Care:  Patient/Guardian was advised Release of Information must be obtained prior to any record release in order to collaborate their care with an outside provider. Patient/Guardian was advised if they have not already done so to contact the registration department to sign all necessary forms in order for [place] to release information regarding their care.    Consent: Patient/Guardian gives verbal consent for treatment and assignment of benefits for services provided during this visit. Patient/Guardian expressed understanding and agreed to proceed.    Letta Moynahan T [place], [place]

## 2023-10-03 NOTE — Progress Notes (Addendum)
            DAP Note   Provider/Clinician's Name: Letta Moynahan Darreld Mclean, [place]   Client Name: Tonya Wilson   Date of Service: 08/07/2023   Duration: 60 mins.         Data: Client reports, that she is all moved in and that she is loving the new place. She stated that she is ready to fill out the [employer] application for furniture. She reported that the holidays were quiet but that she did spend some time with her family and that they were just missing their mom. She reports that she is handling everything ok, but that it has just been tough. Client reports her anxiety level is down to a 4 this week and would like to continue working to get that down to a 3 with the use of interventions.   Assessment: The client appeared upbeat and positive. Moving seems to have been a fresh start which is what she was hoping for. She is excited to complete the process by applying to get new furniture. We discussed how the transition of her moving will most likely create this new space for her to really begin to grieve the loss of her mother. We discussed the need to allow those feeling but to also be as prepared as she can be for those feeling to come and to embrace them. This is the last step in prioritizing her mental health.    Plan: Attend weekly sessions with a goal of 4 times per month. Client will implement a moment of mindfulness at the start and end of each day to clear mind. Client will practice box breathing when anxiety is running high. Client will start writing down positive affirmations in a book to help boost self confidence. We have started working through the 5 stages of grief, with the goal of adjusting to the stage that the client is currently in.

## 2023-10-06 ENCOUNTER — Telehealth: Admitting: Psychology

## 2023-10-11 NOTE — Progress Notes (Signed)
            DAP Note   Provider/Clinician's Name: Letta Moynahan Darreld Mclean, [place]   Client Name: Tonya Wilson   Date of Service: 08/17/2023   Duration: 60 mins.         Data: Client reports, that now that things are settled she is having to really embrace the quiet moments. She states that this is really bringing up how much she is missing her mother. She reports that she is working through the grief as best as she can, a little at a time. She reports that sometimes she is just numb and that she can barley get out of the bed. Client reports her anxiety level is up to a 6 this week and would like to continue working to get that down to a 3 with the use of interventions.   Assessment: The client was evidently sad. We had been trying to prepare as best as we could for the quiet. She had been so busy fueling fires, so we knew when things were settled and in a better place that there would be a lot more time to think about feelings. We discussed embracing those emotions and finally allowing herself to truly grieve her mother. Moving forward we will really focus on some grief therapy to provide tools and resources to the client. This will hopefully allow her to find some acceptance with everything that has happened.   Plan: Attend weekly sessions with a goal of 4 times per month. Client will implement a moment of mindfulness at the start and end of each day to clear mind. Client will practice box breathing when anxiety is running high. Client will start writing down positive affirmations in a book to help boost self confidence. We have started working through the 5 stages of grief, with the goal of adjusting to the stage that the client is currently in.

## 2023-10-13 ENCOUNTER — Telehealth: Admitting: Psychology

## 2023-10-13 DIAGNOSIS — F411 Generalized anxiety disorder: Secondary | ICD-10-CM | POA: Diagnosis not present

## 2023-10-13 DIAGNOSIS — F341 Dysthymic disorder: Secondary | ICD-10-CM

## 2023-10-13 DIAGNOSIS — F410 Panic disorder [episodic paroxysmal anxiety] without agoraphobia: Secondary | ICD-10-CM

## 2023-10-17 NOTE — Progress Notes (Signed)
            DAP Note   Provider/Clinician's Name: Letta Moynahan Darreld Mclean, [place]   Client Name: Tonya Wilson   Date of Service: 08/25/2023   Duration: 60 mins.         Data: Client reports that things are a little better this week. She states that she has spent some time with family and that she is doing her best to embrace the time that she needs. While also stating that she is not closing herself off to everything. Client reports her anxiety level is down to a 4 this week and would like to continue working to get that down to a 3 with the use of interventions.   Assessment: The client appeared to be more upbeat. She is enjoying the time that she is spending with the family. The kids make her happy. She is also still acknowledging the grief surrounding her mother. She has her moments but she has been working this week and trying to push through, all while being kind to herself when she can not.    Plan: Attend weekly sessions with a goal of consistently 4 times per month. Client will implement a moment of mindfulness at the start and end of each day to clear mind. Client will practice box breathing when anxiety is running high. Client will start writing down positive affirmations in a book to help boost self confidence. We have started working through the 5 stages of grief, with the goal of adjusting the needs to the stage that the client is currently in.

## 2023-10-19 ENCOUNTER — Telehealth: Admitting: Psychology

## 2023-10-19 DIAGNOSIS — F410 Panic disorder [episodic paroxysmal anxiety] without agoraphobia: Secondary | ICD-10-CM

## 2023-10-19 DIAGNOSIS — F411 Generalized anxiety disorder: Secondary | ICD-10-CM

## 2023-10-19 DIAGNOSIS — F341 Dysthymic disorder: Secondary | ICD-10-CM

## 2023-10-20 ENCOUNTER — Other Ambulatory Visit: Payer: No Typology Code available for payment source

## 2023-10-24 ENCOUNTER — Other Ambulatory Visit: Admitting: Psychology

## 2023-10-24 ENCOUNTER — Ambulatory Visit: Payer: No Typology Code available for payment source | Admitting: Internal Medicine

## 2023-10-31 NOTE — Progress Notes (Signed)
            DAP Note   Provider/Clinician's Name: Letta Moynahan Darreld Mclean, [place]   Client Name: Tonya Wilson   Date of Service: 09/08/2023   Duration: 60 mins.         Data: Client reports that things are slowly getting better. She reported that she worked all 5 days last week. She stated that she is really trying to keep things as normal as possible. 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 Client reports her anxiety level is down to a 4 this week and would like to continue working to get that down to a 3 with the use of interventions.   Assessment: The client appeared to be feeling better, as evidence by her working more and her getting back into her music. She is also trying very hard to stick to her schedule and it appears that each week is getting better. She is also taking better care of herself by adding in walking daily and showering more often. She knows that this helps her feel better overall.   Plan: Attend weekly sessions with a goal of consistently 4 times per month. Client will implement a moment of mindfulness at the start and end of each day to clear mind. Client will practice box breathing when anxiety is running high. Client will start writing down positive affirmations in a book to help boost self confidence.

## 2023-11-03 ENCOUNTER — Other Ambulatory Visit

## 2023-11-08 ENCOUNTER — Ambulatory Visit: Admitting: Internal Medicine

## 2023-11-13 NOTE — Progress Notes (Signed)
            DAP Note   Provider/Clinician's Name: Eligah Grow Starling Eck, [place]   Client Name: Tonya Wilson   Date of Service: 09/21/2023   Duration: 60 mins.         Data: Client reports that things are continuing to slowly get better. She states that she is still focused on her goals and putting her life back together. She states that she is only doing things that are keeping her stress low and making her feel good. 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 Client reports her anxiety level is down to a 3 this week and would like to continue working to get that down to a 2 with the use of interventions.   Assessment: The client appears to be feeling better with time. She allows the days when she needs to grieve to do so and then she tries to push through. The last few sessions you can hear small amounts of hope and peace in her voice. Her getting back into her music was huge, she stated that she was happy and it appeared that she was having fun. It is so important that she find things that bring her joy as she continues to heal.   Plan: Attend weekly sessions with a goal of consistently 4 times per month. Client will implement a moment of mindfulness at the start and end of each day to clear mind. Client will practice box breathing when anxiety is running high. Client will start writing down positive affirmations in a book to help boost self confidence. Client will continue to work through grief by finding acceptance and also her path forward.

## 2023-11-14 ENCOUNTER — Telehealth: Admitting: Psychology

## 2023-11-14 DIAGNOSIS — F410 Panic disorder [episodic paroxysmal anxiety] without agoraphobia: Secondary | ICD-10-CM

## 2023-11-14 DIAGNOSIS — Z09 Encounter for follow-up examination after completed treatment for conditions other than malignant neoplasm: Secondary | ICD-10-CM

## 2023-11-14 DIAGNOSIS — F341 Dysthymic disorder: Secondary | ICD-10-CM

## 2023-11-14 DIAGNOSIS — F411 Generalized anxiety disorder: Secondary | ICD-10-CM

## 2023-11-16 ENCOUNTER — Other Ambulatory Visit: Payer: No Typology Code available for payment source

## 2023-11-24 NOTE — Progress Notes (Signed)
            DAP Note   Provider/Clinician's Name: Eligah Grow Starling Eck, [place]   Client Name: Sarra Hopler   Date of Service: 09/27/2023   Duration: 60 mins.         Data: Client reports that things are continuing to slowly get better. She states that she is still focused on her goals and putting her life back together. Client states that she is keeping stress low and focusing on work. Client reports her anxiety level is down to a 3 this week and would like to continue working to get that down to a 2 with the use of interventions.   Assessment: The client appears to be feeling better with time. Since she is feeling better she is focused on work right now, she needs to catch up financially so that does not become a burden. It is so important that she continues to find things that bring her joy as she continues on this healing journey.   Plan: Attend weekly sessions with a goal of consistently 4 times per month. Client will implement a moment of mindfulness at the start and end of each day to clear mind. Client will practice box breathing when anxiety is running high. Client will start writing down positive affirmations in a book to help boost self confidence. Client will continue to work through grief by finding acceptance and also her path forward.

## 2023-12-11 NOTE — Progress Notes (Signed)
 DAP Note   Provider/Clinician's Name: Eligah Grow Starling Eck, [place]   Client Name: Tonya Wilson   Date of Service: 10/13/2023   Duration: 60 mins.         Data: Client reports that things are going pretty good. She states that she is still focused on her goals and putting her life back together. Client states that she is coming to a place where she feels that she is ready to get another job. She reports that up to this point she was unsure because of needing the extra time off because of her mom. She states that she is feeling like she is able to work consistently. She reports that she is still trying to process and heal. Client reports her anxiety level is at a 4 this week and would like to continue working to get that down to a 2 with the use of interventions.   Assessment: The client appears to be feeling better with time. She is working really hard to process everything. Getting another job is a next step for the client however she had to be ready to move forward. She appears to have gotten to this place.     Plan: Attend weekly sessions with a goal of consistently 4 times per month. Client will implement a moment of mindfulness at the start and end of each day to clear mind. Client will practice box breathing when anxiety is running high. Client will start writing down positive affirmations in a book to help boost self confidence. Client will continue to work through grief by finding acceptance and also her path forward. Client will begin applying for 2 jobs per week.

## 2023-12-11 NOTE — Progress Notes (Signed)
 DAP Note   Provider/Clinician's Name: Eligah Grow Starling Eck, [place]   Client Name: Tonya Wilson   Date of Service: 10/19/2023   Duration: 60 mins.         Data: Client reports that things okay. She reports that she has gotten back into her music, which she says felt very good. She also stated that her ex is letting her see the kids. She stated that she had the kids and her niece and nephew and had a big sleep over. She stated that it filled her heart with joy. Client reports her anxiety level is at a 3 this week and would like to continue working to get that down to a 2 with the use of interventions.   Assessment: The client appears to be feeling better and better. She was so happy and fulfilled talking about her music and the kids. It has taken time but she seems to really be getting back to herself. She is clearly still working through things but she is trying very hard to do so.   Plan: Attend weekly sessions with a goal of consistently 4 times per month. Client will implement a moment of mindfulness at the start and end of each day to clear mind. Client will practice box breathing when anxiety is running high. Client will start writing down positive affirmations in a book to help boost self confidence. Client will continue to work through grief by finding acceptance and also her path forward. Client will begin applying for 2 jobs per week.

## 2024-01-04 ENCOUNTER — Other Ambulatory Visit

## 2024-01-04 DIAGNOSIS — R7989 Other specified abnormal findings of blood chemistry: Secondary | ICD-10-CM

## 2024-01-04 DIAGNOSIS — I10 Essential (primary) hypertension: Secondary | ICD-10-CM

## 2024-01-04 DIAGNOSIS — N183 Chronic kidney disease, stage 3 unspecified: Secondary | ICD-10-CM

## 2024-01-05 LAB — COMPREHENSIVE METABOLIC PANEL WITH GFR
ALT: 6 IU/L (ref 0–32)
AST: 13 IU/L (ref 0–40)
Albumin: 4.4 g/dL (ref 3.9–4.9)
Alkaline Phosphatase: 63 IU/L (ref 44–121)
BUN/Creatinine Ratio: 7 — ABNORMAL LOW (ref 9–23)
BUN: 10 mg/dL (ref 6–24)
Bilirubin Total: 0.3 mg/dL (ref 0.0–1.2)
CO2: 19 mmol/L — ABNORMAL LOW (ref 20–29)
Calcium: 9.4 mg/dL (ref 8.7–10.2)
Chloride: 106 mmol/L (ref 96–106)
Creatinine, Ser: 1.52 mg/dL — ABNORMAL HIGH (ref 0.57–1.00)
Globulin, Total: 3 g/dL (ref 1.5–4.5)
Glucose: 84 mg/dL (ref 70–99)
Potassium: 4.5 mmol/L (ref 3.5–5.2)
Sodium: 140 mmol/L (ref 134–144)
Total Protein: 7.4 g/dL (ref 6.0–8.5)
eGFR: 44 mL/min/{1.73_m2} — ABNORMAL LOW (ref >59)

## 2024-01-05 LAB — MICROALBUMIN / CREATININE URINE RATIO
Creatinine, Urine: 116.4 mg/dL
Microalb/Creat Ratio: 347 mg/g{creat} — ABNORMAL HIGH (ref 0–29)
Microalbumin, Urine: 404.3 ug/mL

## 2024-01-05 LAB — VITAMIN D 25 HYDROXY (VIT D DEFICIENCY, FRACTURES): Vit D, 25-Hydroxy: 20.8 ng/mL — ABNORMAL LOW (ref 30.0–100.0)

## 2024-01-10 NOTE — Progress Notes (Signed)
 Case Management 11/14/2023  Helped the client fill out paperwork for FMLA. It needed to be updated and sent back to her work. Client is doing a little better mentally. She is trying to continue to move forward from the death of her mother but she still struggles at times and needs the option to take a day off when needed. Client will make a therapy appointment within the week.

## 2024-01-23 ENCOUNTER — Ambulatory Visit (INDEPENDENT_AMBULATORY_CARE_PROVIDER_SITE_OTHER): Admitting: Psychology

## 2024-01-23 DIAGNOSIS — F410 Panic disorder [episodic paroxysmal anxiety] without agoraphobia: Secondary | ICD-10-CM | POA: Diagnosis not present

## 2024-01-23 DIAGNOSIS — F411 Generalized anxiety disorder: Secondary | ICD-10-CM | POA: Diagnosis not present

## 2024-01-23 DIAGNOSIS — F341 Dysthymic disorder: Secondary | ICD-10-CM | POA: Diagnosis not present

## 2024-02-12 ENCOUNTER — Ambulatory Visit: Payer: Self-pay | Admitting: Internal Medicine

## 2024-02-15 ENCOUNTER — Ambulatory Visit (INDEPENDENT_AMBULATORY_CARE_PROVIDER_SITE_OTHER): Admitting: Psychology

## 2024-02-15 DIAGNOSIS — F411 Generalized anxiety disorder: Secondary | ICD-10-CM

## 2024-02-15 DIAGNOSIS — F341 Dysthymic disorder: Secondary | ICD-10-CM

## 2024-02-15 DIAGNOSIS — F410 Panic disorder [episodic paroxysmal anxiety] without agoraphobia: Secondary | ICD-10-CM | POA: Diagnosis not present

## 2024-02-20 ENCOUNTER — Ambulatory Visit (INDEPENDENT_AMBULATORY_CARE_PROVIDER_SITE_OTHER): Admitting: Psychology

## 2024-02-20 DIAGNOSIS — F410 Panic disorder [episodic paroxysmal anxiety] without agoraphobia: Secondary | ICD-10-CM | POA: Diagnosis not present

## 2024-02-20 DIAGNOSIS — F411 Generalized anxiety disorder: Secondary | ICD-10-CM | POA: Diagnosis not present

## 2024-02-20 DIAGNOSIS — F341 Dysthymic disorder: Secondary | ICD-10-CM

## 2024-03-04 ENCOUNTER — Encounter: Payer: Self-pay | Admitting: Internal Medicine

## 2024-03-08 ENCOUNTER — Other Ambulatory Visit: Payer: Self-pay

## 2024-03-08 MED ORDER — LEVOCETIRIZINE DIHYDROCHLORIDE 5 MG PO TABS: 5.0000 mg | ORAL_TABLET | Freq: Every evening | ORAL | 11 refills | Status: AC

## 2024-03-12 NOTE — Progress Notes (Signed)
 DAP Note   Provider/Clinician's Name: Camie Norris Higinio Chang, [place]   Client Name: Tonya Wilson   Date of Service: 01/23/2024   Duration: 60 mins.         Data: Client reports that she is doing okay. She states that she took a little break from therapy because she was so overwhelmed with everything else. She reported that therapy was just wiping her out but that she realized that she needed it because she is working on the Tax adviser. Client reports her anxiety level is at a 5 this week and would like to continue working to get that down to a 2 with the use of interventions.   Assessment: The client was struggling with her emotions. In the past she is used to just shutting down and not dealing with things. The break gave her time to think and realize that she does need the therapy to help her continue to work through her feelings and emotions.    Plan: Attend weekly sessions with a goal of consistently 4 times per month. Client will implement a moment of mindfulness at the start and end of each day to clear mind. Client will practice box breathing when anxiety is running high. Client will start writing down positive affirmations in a book to help boost self confidence. Client will continue to work through grief by finding acceptance and also her path forward.

## 2024-03-13 ENCOUNTER — Ambulatory Visit (INDEPENDENT_AMBULATORY_CARE_PROVIDER_SITE_OTHER): Admitting: Psychology

## 2024-03-13 DIAGNOSIS — F411 Generalized anxiety disorder: Secondary | ICD-10-CM | POA: Diagnosis not present

## 2024-03-13 DIAGNOSIS — F341 Dysthymic disorder: Secondary | ICD-10-CM

## 2024-03-13 DIAGNOSIS — F410 Panic disorder [episodic paroxysmal anxiety] without agoraphobia: Secondary | ICD-10-CM

## 2024-03-21 ENCOUNTER — Ambulatory Visit (INDEPENDENT_AMBULATORY_CARE_PROVIDER_SITE_OTHER): Admitting: Psychology

## 2024-03-21 DIAGNOSIS — F411 Generalized anxiety disorder: Secondary | ICD-10-CM | POA: Diagnosis not present

## 2024-03-21 DIAGNOSIS — F410 Panic disorder [episodic paroxysmal anxiety] without agoraphobia: Secondary | ICD-10-CM | POA: Diagnosis not present

## 2024-03-21 DIAGNOSIS — F341 Dysthymic disorder: Secondary | ICD-10-CM

## 2024-04-04 ENCOUNTER — Ambulatory Visit: Admitting: Internal Medicine

## 2024-04-04 ENCOUNTER — Ambulatory Visit: Admitting: Psychology

## 2024-04-04 VITALS — BP 100/90 | HR 73 | Resp 17 | Ht 62.0 in | Wt 160.0 lb

## 2024-04-04 DIAGNOSIS — F411 Generalized anxiety disorder: Secondary | ICD-10-CM | POA: Diagnosis not present

## 2024-04-04 DIAGNOSIS — F410 Panic disorder [episodic paroxysmal anxiety] without agoraphobia: Secondary | ICD-10-CM | POA: Diagnosis not present

## 2024-04-04 DIAGNOSIS — Z9109 Other allergy status, other than to drugs and biological substances: Secondary | ICD-10-CM

## 2024-04-04 DIAGNOSIS — F341 Dysthymic disorder: Secondary | ICD-10-CM

## 2024-04-04 DIAGNOSIS — G43009 Migraine without aura, not intractable, without status migrainosus: Secondary | ICD-10-CM

## 2024-04-04 DIAGNOSIS — D352 Benign neoplasm of pituitary gland: Secondary | ICD-10-CM

## 2024-04-04 DIAGNOSIS — J45909 Unspecified asthma, uncomplicated: Secondary | ICD-10-CM

## 2024-04-04 DIAGNOSIS — N183 Chronic kidney disease, stage 3 unspecified: Secondary | ICD-10-CM

## 2024-04-04 DIAGNOSIS — I151 Hypertension secondary to other renal disorders: Secondary | ICD-10-CM

## 2024-04-04 MED ORDER — CLONIDINE HCL ER 0.1 MG PO TB12: ORAL_TABLET | ORAL | 11 refills | Status: DC

## 2024-04-04 MED ORDER — CABERGOLINE 0.5 MG PO TABS: 0.5000 mg | ORAL_TABLET | ORAL | 3 refills | Status: AC

## 2024-04-04 MED ORDER — SODIUM BICARBONATE 650 MG PO TABS: 650.0000 mg | ORAL_TABLET | Freq: Two times a day (BID) | ORAL | 11 refills | Status: AC

## 2024-04-04 MED ORDER — TRAZODONE HCL 50 MG PO TABS: ORAL_TABLET | ORAL | 11 refills | Status: AC

## 2024-04-04 MED ORDER — FLUTICASONE-SALMETEROL 100-50 MCG/ACT IN AEPB: 1.0000 | INHALATION_SPRAY | Freq: Two times a day (BID) | RESPIRATORY_TRACT | 11 refills | Status: AC

## 2024-04-04 NOTE — Progress Notes (Unsigned)
 Farxiga  cost $500/mo and does not appear to be covered. Jardiance  costs $600, though states covered Would like for her to be on this with CKD and protein in urine, but too costly  Not able to afford renal visits.  Regarding to PTSD/depression, she is not taking Cymbalta  since March, as she felt her brain wasn't working well on it--felt flat--couldn't feel emotions. Still meeting with SBT, LCSWA.  Check with SBT as to whether she feels [place] is doing okay without medication for this.    Addendum:  04/10/2024:  spoke with SBT, LCSWA:  states the last couple of weeks she has been getting out and doing well.  Did have an episode of uncontrolled crying over her mother's death just prior to this.  She is working daily during work week also for past 2-3 weeks.  Will hold on restarting meds for this.    Hypertension:  not taking clonidine  3 times daily--missing morning dose as too sleepy to get things done.  Willing to switch to long acting twice daily to see if tolerates better.  Clonidine  ER 0.1 mg in morning and 0.2 mg at night.   BP and pulse check in 2 weeks.  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  No nipple discharge.  She will refill and will check prolactin level in 2 weeks when has more time for labs. Not able to afford endocrine visits.  Allergies better  Asthma:  not using maintenance and using rescue 2 times weekly.

## 2024-04-04 NOTE — Progress Notes (Unsigned)
 Subjective:    Patient ID: Tonya Wilson, female    DOB: October 22, 1980, 43 y.o.   MRN: [id number]  Pt evaluate initially evaluated by me, doctor in training under Dr. Adella, for the allergy f/u.  I then presented to Dr. Adella.   Dr.Mulberry then evaluated pt for the allergies and other health concerns. She did A/P, pt education regarding asthma, allergies, medications, as well as discharge instructions (verbal,written) and orders while I was in the room.   This is a summary of  eval.   HPI-Here for f/u of allergy flare-up that occurred a month ago.   She says that they were replacing the carpet and degutting the door, leaving behind a weird smell.  A lot of construction all around. [place] 8 housing.  All this caused her allergies to flare a month ago with right facial swelling, lymph node enlargement below the right ear, sneezing, watery eyes, chest tightness.  Called Dr. Adella at the time who prescribed xyzol and flonse and albuterol  and steroid/long-acting B agonist inhalers.   Only has one inhaler. Last time inhaler was used was around that time but has not used it since....then she said she might have used inhalers once a week.   Feels better now but continues to have mild watery eyes and nasal stuffiness.   Has anxiety and wants to be indoors and feels that there are allegens in her house.  Works from home. Saw 2 roaches.  Vacuums daily . Aerates home. Using Netti pot. +marijuana.   Not worsened by Marijuana, once to twice a month; used to use daily.   Review of Systems Psych- anxiety ; Not taking feloxatine which was stopped by herself due to grogginess. Has anxiety attacks intermittently.    DM- thinks it is ok. Not sure about her meds. Constitutional- Not getting sleep at night because her brain does not shut off.   Stressed about mom.  GU- periods acting crazy.   Had endocrinologist but cannot afford it anymore.  Prolactin level might be off ; last checked a year ago.  When periods  get irregular this means her prolactin is high. Not taking meds.  Periods are getting closer and closer together.  Neuro- not light-headed when she stands up.  Allergies  Allergen Reactions   Other Other (See Comments)    NO NSAIDS 2/2 CKD PER PT   Peanut-Containing Drug Products Swelling    States that lips swelled after eating   Flagyl  [Metronidazole ] Rash    Current Meds  Medication Sig   acetaminophen  (TYLENOL ) 500 MG tablet Take 1,000 mg by mouth every 6 (six) hours as needed. For pain   albuterol  (VENTOLIN  HFA) 108 (90 Base) MCG/ACT inhaler Inhale 1-2 puffs into the lungs every 6 (six) hours as needed for wheezing or shortness of breath.   amLODipine  (NORVASC ) 5 MG tablet Take 1 tablet (5 mg total) by mouth daily. TAKE 1 TABLET(5 MG) BY MOUTH DAILY   Cholecalciferol (VITAMIN D3) 25 MCG (1000 UT) CAPS Take 1 capsule (1,000 Units total) by mouth daily.   clobetasol  cream (TEMOVATE ) 0.05 % Apply 1 application topically 2 (two) times daily.   cloNIDine  HCl (KAPVAY ) 0.1 MG TB12 ER tablet 1 tab by mouth in morning with breakfast and 2 tabs in evening prior to bed   fluticasone  (FLONASE ) 50 MCG/ACT nasal spray Place 2 sprays into both nostrils daily.   levocetirizine (XYZAL ) 5 MG tablet Take 1 tablet (5 mg total) by mouth every evening.   losartan  (COZAAR )  25 MG tablet TAKE 1 TABLET BY MOUTH DAILY   [DISCONTINUED] cabergoline  (DOSTINEX ) 0.5 MG tablet Take 1 tablet (0.5 mg total) by mouth 2 (two) times a week.    Social History   Tobacco Use   Smoking status: Former    Current packs/day: 0.15    Types: Cigarettes   Smokeless tobacco: Never   Tobacco comments:    Would like to table for now.  Vaping Use   Vaping status: Never Used  Substance Use Topics   Alcohol use: Yes    Comment: occasional wine with dinner   Drug use: No   + maijuana twice a month from daily + housing problems -allergens btu improved from prior    Objective:   Physical Exam HENT:     Head: Normocephalic  and atraumatic.     Right Ear: Tympanic membrane normal.     Left Ear: Tympanic membrane normal.     Nose: Congestion present.     Mouth/Throat:     Mouth: Mucous membranes are moist.     Pharynx: No oropharyngeal exudate.     Comments: Cobbling of OP Eyes:     Conjunctiva/sclera: Conjunctivae normal.     Pupils: Pupils are equal, round, and reactive to light.  Cardiovascular:     Rate and Rhythm: Normal rate.     Comments: Radial pulses normal Pulmonary:     Effort: Pulmonary effort is normal.     Breath sounds: Normal breath sounds.  Musculoskeletal:        General: No swelling (no facial swelling). Normal range of motion.     Cervical back: Normal range of motion. No rigidity or tenderness.  Lymphadenopathy:     Cervical: No cervical adenopathy.  Skin:    Comments: No rash now  Neurological:     Mental Status: She is oriented to person, place, and time.     Comments: No obvious focal deficits. Normal gait,speech,   Psychiatric:        Mood and Affect: Mood normal.        Behavior: Behavior normal.        Thought Content: Thought content normal.        Judgment: Judgment normal.     Comments: Cried when talking about mom      Assessment & Plan:  1- Allergies- now better although there is a baseline level of sxs due to persistant level of enviornmental allergens which the pt is in the midst of trying to remove.- continue current flonase  and Xysol.    Pt to handle her carpetting issue and try to get it fixed so it would not contribute to allergies 2- Asthma- pt educated by Dr. Adella on asthma meds . Continue maintenance and rescue. Does not have spacer.   3-Anxiety- not well controlled. Encouraged to take her meds,   4-Dr. Adella aware of her BP today and her current meds. See her note for the date.    5-Insomnia- adjust meds so she can sleep.  Trazadone  6-Hyperpolactinema- Med renewal. Check blood work today inititally but due to pt request, will do it in 2 weeks  when she comes back.   7-Health Maintenance Appt in 4 months. RTC 2 weeks for BP/P check and labs including CBC ( heavy periods)  and prolactin level

## 2024-04-08 NOTE — Progress Notes (Signed)
 Comprehensive Clinical Assessment (CCA) Note   Tonya Wilson [id number] 02/15/2024 Chief Complaint: Generalized Anxiety Disorder, Depression, Panic Disorder   Visit Diagnosis: Generalized Anxiety Disorder, Depression, Panic disorder     CCA Screening, Triage and Referral (STR)   Patient Reported Information How did you hear about us ? Client has been a patient at [facility] since 2019, doctor recommended Referral name: Landry Bolds Referral phone number: [phone]   Whom do you see for routine medical problems? Landry Bolds Practice/Facility Name: [facility] Practice/Facility Phone Number: [phone] Name of Contact: Landry Bolds Contact Number: [phone] Contact Fax Number: [phone] Prescriber Name: [facility] Prescriber Address (if known): [address]   What Is the Reason for Your Visit/Call Today? Therapy How Long Has This Been Causing You Problems? I have struggled with anxiety and depression for years to the point of anxiety attack. What Do You Feel Would Help You the Most Today? Weekly therapy sessions   Have You Recently Been in Any Inpatient Treatment (Hospital/Detox/Crisis Center/28-Day Program)? No Name/Location of Program/Hospital:N/A How Long Were You There? N/A When Were You Discharged? N/A   Have You Ever Received Services From [employer] Before? Yes Who Do You See at [facility]? Referrals and emergency room visits   Have You Recently Had Any Thoughts About Hurting Yourself? No, never Are You Planning to Commit Suicide/Harm Yourself At This time? No   Have you Recently Had Thoughts About Hurting Someone [place]? No Explanation: N/A   Have You Used Any Alcohol or Drugs in the Past 24 Hours? I drink occasionally on the weekends, maybe 1-2 beers, 1 day per day. I smoke mariajuana most nights. How Long Ago Did You Use Drugs or Alcohol? Yesterday What Did You Use and How Much? Mariajuana, a few hits (maybe 3 or 4)   Do You Currently Have a  Therapist/Psychiatrist? Yes Name of Therapist/Psychiatrist: Therapist, Camie Almarie Higinio [place], LCSWA   Have You Been Recently Discharged From Any Office Practice or Programs? No Explanation of Discharge From Practice/Program: N/A                CCA Screening Triage Referral Assessment Type of Contact: In person Is this Initial or Reassessment? Reassessment Date Telepsych consult ordered in [facility]: 02/15/2024 Time Telepsych consult ordered in [facility]: 9 am   Patient Reported Information Reviewed? Information received from office Patient Left Without Being Seen? No Reason for Not Completing Assessment: N/A   Collateral Involvement: No   Does Patient Have a Court Appointed Legal Guardian? No Name and Contact of Legal Guardian: N/A If Minor and Not Living with Parent(s), Who has Custody? N/A Is CPS involved or ever been involved? N/A Is APS involved or ever been involved? No   Patient Determined To Be At Risk for Harm To Self or Others Based on Review of Patient Reported Information or Presenting Complaint? No Method: N/A Availability of Means: N/A Intent: N/A Notification Required: N/A Additional Information for Danger to Others Potential: N/A Additional Comments for Danger to Others Potential: N/A Are There Guns or Other Weapons in Your Home? N/A Types of Guns/Weapons: N/A Are These Weapons Safely Secured? N/A Who Could Verify You Are Able To Have These Secured: N/A Do You Have any Outstanding Charges, Pending Court Dates, Parole/Probation? No Contacted To Inform of Risk of Harm To Self or Others: No   Location of Assessment: In office, [facility]   Does Patient Present under Involuntary Commitment? No IVC Papers Initial File Date: N/A   [place] of Residence: [employer]  Patient Currently Receiving the Following Services: Help with food insecurity, grief counseling   Determination of Need: Moderate   Options For Referral: Local food pantries       CCA  Biopsychosocial Intake/Chief Complaint:  Anxiety and depression Current Symptoms/Problems: High anxiety and moments of feeling low    Patient Reported Schizophrenia/Schizoaffective Diagnosis in Past: No   Strengths: hard working, confident, easy going, funny, resilient, persistent, perseverance  Preferences: organization, stability Abilities: IT related jobs, dependable, responsible, loyal, determined   Type of Services Patient Feels are Needed: help with food, possible help with bills   Initial Clinical Notes/Concerns: Patient has high anxiety at times, it can be debilitating. Client generally pushes through but it can be tough.   Mental Health Symptoms Depression:  Yes, sleeps a lot, sad, tearful, feels hopeless, feels numb  Duration of Depressive symptoms: Years  Mania:  No  Anxiety:   Yes, feels sick, feels out of control of body, worries a lot  Psychosis:  No  Duration of Psychotic symptoms: No  Trauma:  Yes, specifically from the past, mom died unexpectedly, dad also passed all happened around her birthday.   Obsessions:  No  Compulsions:  No  Inattention:  At times if anxiety is really high, has trouble focusing  Hyperactivity/Impulsivity:  No  Oppositional/Defiant Behaviors:  No  Emotional Irregularity:  Yes, has pent up anger that will come out when pushed.  Other Mood/Personality Symptoms:  No    Mental Status Exam Appearance and self-care  Stature:  5 foot 2 inches  Weight: 153 lbs.  Clothing:  Clean, well kept  Grooming:  Well groomed  Cosmetic use:  None  Posture/gait:  Normal range  Motor activity:  Normal range  Sensorium  Attention:  Has tendency to loose focus at times  Concentration:  Normal  Orientation:  A + O  Recall/memory:  Normal range  Affect and Mood  Affect:  Can be emotional and anxious at times  Mood:  Mostly a good disposition  Relating  Eye contact:  Normal range  Facial expression:  Normal range  Attitude toward examiner:  Pleasant   Thought and Language  Speech flow: Normal, well spoken  Thought content:  Normal range  Preoccupation:   Normal range  Hallucinations:  None  Organization:  Normal range  [employer] of Knowledge:  Normal range  Intelligence:  Normal range  Abstraction:  Normal range  Judgement:  Normal range  Reality Testing:  Normal range  Insight:  Normal range  Decision Making:  Normal range  Social Functioning  Social Maturity:  Normal range  Social Judgement:  Normal range  Stress  Stressors:  Money, family, mom's death  Coping Ability:  Normal range  Skill Deficits:  Anger  Supports:  Family, cats, friends, [facility]      Religion: Christian   Leisure/Recreation:Working on getting the client out of the house to start hanging out with friends or being involved with things that might bring her joy. Client will visit family and run errands. She enjoys music.     Exercise/Diet: Client eats a healthy diet, she does not exercise regularly. She does walk a lot.     CCA Employment/Education Employment/Work Situation: Client is employed full time. She has a job doing Consulting civil engineer.     Education: [employer] diploma       CCA Family/Childhood History Family and Relationship History: Client has trauma with family from the past. The relationship with her mother and sisters can be  strained at times, but they always work it out. Recently. She had gotten much closer with her mom and one sister is her best friend until her mom passed in September. Client has had several romantic relationships in the past. There is a history of ups and downs, until the downs outweigh the ups and the client feels the need to end them. The last relationship was very tough and that is what started her journey with therapy. She has a daughter who is not her biological daughter by blood but has always treated her like she is. She has raised her and only in the last year have they taken a break from one another. The  daughter went back to live with her mother but quickly realized that the client was her mother and is coming back to live with her. They still talk they just live separate.     Childhood History:  Childhood was challenging at times. Client struggled with mental health in teen years because of this. Client has spent a lot of time working through that to repair relationships with her family.    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 The client reports that she spent a lot of time working through that and did not drink for a long time. Client reports that she only drinks socially on the weekends, 1-2 at most, 1 day per week. Client reports that she does smoke mariajuana most night, 3-4 hits at most to calm her nerves.         ASAM's:  Six Dimensions of Multidimensional Assessment   Dimension 1:  Acute Intoxication and/or Withdrawal Potential: None  Dimension 2:  Biomedical Conditions and Complications:  Her past drinking has caused problems with her body. She reports that she tries to make sure that she is taking the best care of herself that she can.  Dimension 3:  Emotional, Behavioral, or Cognitive Conditions and Complications:  None  Dimension 4:  Readiness to Change:  None  Dimension 5:  Relapse, Continued use, or Continued Problem Potential:  Continued use, but client feels this is where she is realistically and has no desire to go backwards.  Dimension 6:  Recovery/Living Environment:  N/A  ASAM Severity Score:    ASAM Recommended Level of Treatment:      Substance use Disorder (SUD)     Recommendations for Services/Supports/Treatments:Continue substance use assessment and weekly check ins.      DSM5 Diagnoses:        Patient Active Problem List    Diagnosis Date Noted   Vitamin D  deficiency 01/01/2023   Microalbuminuria 01/01/2023   Insomnia 05/26/2021   Panic  disorder 01/23/2021   Anxiety and depression 01/23/2021   Anemia     Chronic kidney disease     Pituitary adenoma (HCC) 05/28/2020   Prolactinoma (HCC) 02/26/2020   Malignant neoplasm of pituitary gland (HCC) 02/26/2020   Migraine with aura and with status migrainosus, not intractable 02/26/2020   Increased frequency of headaches 02/26/2020   Persistent proteinuria 02/26/2020   Premature infant with birthweight 1000-2499 grams 02/26/2020   Migraine without aura and without status migrainosus, not intractable 12/25/2019   Raynaud's phenomenon without gangrene 12/25/2019   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   Constipation 06/25/2019   Menstrual migraine without status migrainosus, not intractable 06/25/2019   Hypertension     Infertility,  female     Asthma     Seasonal allergies     Uterine leiomyoma 07/25/2016   Prematurity 07/25/1981      Patient Centered Plan: Patient is on the following Treatment Plan(s):  Anxiety, depression, and occasional panic disorder     Referrals to Alternative Service(s): Referred to Alternative Service(s):  [employer] [facility] Place:  [facility] Date:  01/10/205 Time:    Referred to Alternative Service(s):   Place:   Date:   Time:    Referred to Alternative Service(s):   Place:   Date:   Time:    Referred to Alternative Service(s):   Place:   Date:   Time:        Collaboration of Care:  Patient/Guardian was advised Release of Information must be obtained prior to any record release in order to collaborate their care with an outside provider. Patient/Guardian was advised if they have not already done so to contact the registration department to sign all necessary forms in order for us  to release information regarding their care.    Consent: Patient/Guardian gives verbal consent for treatment and assignment of benefits for services provided during this visit. Patient/Guardian expressed understanding and agreed to proceed.     Camie Norris T [place], LCSWA

## 2024-04-08 NOTE — Progress Notes (Unsigned)
 DAP Note   Provider/Clinician's Name: Camie Norris Higinio Chang, [place]   Client Name: Zahli Vetsch   Date of Service: 02/20/2024   Duration: 60 mins.         Data: Client was tearful through session. Client reports that she feels like she is not making any progress. She reports that she does not even understand how she has gotten through the past 10 months. She reports that some days are good and some she just feels so lost. Client reports her anxiety level is at a 7 this week and would like to continue working to get that down to a 2 with the use of interventions.   Assessment: Client presented with sadness. She has had a very hard time with the grief that she has felt from the death of her mom. The death was so sudden, unexpected, and traumatic that it has taken a long time to process all of that. We discussed all the progress that she has made because in these moments it is sometimes hard to separate different things. Grief has no timeline which is what makes it so hard.   Plan: Attend weekly sessions with a goal of consistently 4 times per month. Client will implement a moment of mindfulness at the start and end of each day to clear mind. Client will practice box breathing when anxiety is running high. Client will start writing down positive affirmations in a book to help boost self confidence. Client will continue to work through grief by finding acceptance and also her path forward.

## 2024-04-12 ENCOUNTER — Ambulatory Visit (INDEPENDENT_AMBULATORY_CARE_PROVIDER_SITE_OTHER): Admitting: Psychology

## 2024-04-12 DIAGNOSIS — F341 Dysthymic disorder: Secondary | ICD-10-CM | POA: Diagnosis not present

## 2024-04-12 DIAGNOSIS — F410 Panic disorder [episodic paroxysmal anxiety] without agoraphobia: Secondary | ICD-10-CM | POA: Diagnosis not present

## 2024-04-12 DIAGNOSIS — F411 Generalized anxiety disorder: Secondary | ICD-10-CM | POA: Diagnosis not present

## 2024-04-18 ENCOUNTER — Encounter

## 2024-04-25 ENCOUNTER — Ambulatory Visit

## 2024-04-25 VITALS — BP 117/79

## 2024-04-25 DIAGNOSIS — D352 Benign neoplasm of pituitary gland: Secondary | ICD-10-CM

## 2024-04-25 DIAGNOSIS — Z0130 Encounter for examination of blood pressure without abnormal findings: Secondary | ICD-10-CM

## 2024-04-28 ENCOUNTER — Other Ambulatory Visit: Payer: Self-pay | Admitting: Internal Medicine

## 2024-04-29 NOTE — Progress Notes (Signed)
 DAP Note   Provider/Clinician's Name: Camie Norris Higinio Chang, [place]   Client Name: Sabriyah Wilcher   Date of Service: 03/13/2024   Duration: 60 mins.         Data: Client reported that she was a bit better. She stated that sometimes she feels like she is making progress and other times she just feels so empty and lost again. She states that she does want to plan something for her birthday. She also reports that she might want to consider a more intense grief counseling. Client reports her anxiety level is at a 7 this week and would like to continue working to get that down to a 2 with the use of interventions.   Assessment: Client presented as a calm presence but still very sad. It is coming up on the year anniversary that her mom passed. We discussed the grief loop and that this maybe why she is having such a tough time. One year is a significant hoop to jump through. She is looking forward to the celebration that she is planning with her siblings for her mom. Hopefully, this time will give her some peace.    Plan: Attend weekly sessions with a goal of consistently 4 times per month. Client will implement a moment of mindfulness at the start and end of each day to clear mind. Client will practice box breathing when anxiety is running high. Client will start writing down positive affirmations in a book to help boost self confidence. Client will continue to work through grief by finding acceptance and also her path forward.

## 2024-05-01 ENCOUNTER — Ambulatory Visit (INDEPENDENT_AMBULATORY_CARE_PROVIDER_SITE_OTHER): Admitting: Psychology

## 2024-05-01 DIAGNOSIS — F341 Dysthymic disorder: Secondary | ICD-10-CM | POA: Diagnosis not present

## 2024-05-01 DIAGNOSIS — F410 Panic disorder [episodic paroxysmal anxiety] without agoraphobia: Secondary | ICD-10-CM | POA: Diagnosis not present

## 2024-05-01 DIAGNOSIS — F411 Generalized anxiety disorder: Secondary | ICD-10-CM

## 2024-05-01 DIAGNOSIS — F4381 Prolonged grief disorder: Secondary | ICD-10-CM

## 2024-05-03 LAB — PROLACTIN: Prolactin: 97.9 ng/mL — ABNORMAL HIGH (ref 4.8–33.4)

## 2024-05-06 DIAGNOSIS — F411 Generalized anxiety disorder: Secondary | ICD-10-CM | POA: Insufficient documentation

## 2024-05-06 DIAGNOSIS — F341 Dysthymic disorder: Secondary | ICD-10-CM | POA: Insufficient documentation

## 2024-05-06 NOTE — Progress Notes (Signed)
 DAP Note   Provider/Clinician's Name: Camie Norris Higinio Chang, [place]   Client Name: Tonya Wilson   Date of Service: 03/21/2024   Duration: 60 mins.         Data: Client reported that she was doing well. She reported that she was feeling better. She states that they had shirts printed up for her mom and they are going to be great. She states that she is looking forward to being with everyone to celebrate the life of her mom, she just can not believe it has been a year. Client reports her anxiety level is at a 5 this week and would like to continue working to get that down to a 2 with the use of interventions.   Assessment: Client presented as a calm presence and in better spirits. She was looking forward to being with her family and that was lifting her spirits. It also seemed as though she processed a lot of the emotions that she had the week before and was doing her best to proceed forward on her healing journey.    Plan: Attend weekly sessions with a goal of consistently 4 times per month. Client will implement a moment of mindfulness at the start and end of each day to clear mind. Client will practice box breathing when anxiety is running high. Client will start writing down positive affirmations in a book to help boost self confidence. Client will continue to work through grief by finding acceptance and also her path forward.

## 2024-05-09 ENCOUNTER — Other Ambulatory Visit: Payer: Self-pay | Admitting: Internal Medicine

## 2024-05-10 ENCOUNTER — Telehealth: Payer: Self-pay | Admitting: Internal Medicine

## 2024-05-10 NOTE — Telephone Encounter (Signed)
 Patient would like to know if medication prescribed to her   cloNIDine  HCl (KAPVAY ) 0.1 MG TB12 ER tablet [[id number]]   Can be changed to the generic medication.  Patient reports she was prescribed the extended medication and the medication is $50 a month and patient states this is something she can not afford.

## 2024-05-14 ENCOUNTER — Ambulatory Visit (INDEPENDENT_AMBULATORY_CARE_PROVIDER_SITE_OTHER): Admitting: Psychology

## 2024-05-14 DIAGNOSIS — F410 Panic disorder [episodic paroxysmal anxiety] without agoraphobia: Secondary | ICD-10-CM

## 2024-05-14 DIAGNOSIS — F341 Dysthymic disorder: Secondary | ICD-10-CM

## 2024-05-14 DIAGNOSIS — F4381 Prolonged grief disorder: Secondary | ICD-10-CM | POA: Diagnosis not present

## 2024-05-14 DIAGNOSIS — F411 Generalized anxiety disorder: Secondary | ICD-10-CM | POA: Diagnosis not present

## 2024-05-14 NOTE — Telephone Encounter (Signed)
 Patient also needs refill of losartan  medication.

## 2024-05-22 ENCOUNTER — Ambulatory Visit (INDEPENDENT_AMBULATORY_CARE_PROVIDER_SITE_OTHER): Admitting: Psychology

## 2024-05-22 DIAGNOSIS — F4381 Prolonged grief disorder: Secondary | ICD-10-CM

## 2024-05-22 DIAGNOSIS — F411 Generalized anxiety disorder: Secondary | ICD-10-CM | POA: Diagnosis not present

## 2024-05-22 DIAGNOSIS — F341 Dysthymic disorder: Secondary | ICD-10-CM | POA: Diagnosis not present

## 2024-05-22 DIAGNOSIS — F410 Panic disorder [episodic paroxysmal anxiety] without agoraphobia: Secondary | ICD-10-CM | POA: Diagnosis not present

## 2024-05-23 MED ORDER — CLONIDINE HCL 0.1 MG PO TABS: 0.1000 mg | ORAL_TABLET | Freq: Three times a day (TID) | ORAL | 11 refills | Status: AC

## 2024-05-26 ENCOUNTER — Ambulatory Visit: Payer: Self-pay | Admitting: Internal Medicine

## 2024-05-28 ENCOUNTER — Ambulatory Visit: Admitting: Psychology

## 2024-05-28 DIAGNOSIS — F411 Generalized anxiety disorder: Secondary | ICD-10-CM

## 2024-05-28 DIAGNOSIS — F410 Panic disorder [episodic paroxysmal anxiety] without agoraphobia: Secondary | ICD-10-CM

## 2024-05-28 DIAGNOSIS — F341 Dysthymic disorder: Secondary | ICD-10-CM | POA: Diagnosis not present

## 2024-05-28 DIAGNOSIS — F4381 Prolonged grief disorder: Secondary | ICD-10-CM | POA: Diagnosis not present

## 2024-05-30 NOTE — Progress Notes (Signed)
 Does not answer the phone. I left a voice massage to call us  back

## 2024-06-03 NOTE — Progress Notes (Signed)
 Called her again today and she did not answered. I Left a call back voice massage

## 2024-06-06 ENCOUNTER — Ambulatory Visit: Admitting: Psychology

## 2024-06-06 DIAGNOSIS — F341 Dysthymic disorder: Secondary | ICD-10-CM

## 2024-06-06 DIAGNOSIS — F411 Generalized anxiety disorder: Secondary | ICD-10-CM

## 2024-06-06 DIAGNOSIS — F4381 Prolonged grief disorder: Secondary | ICD-10-CM

## 2024-06-06 DIAGNOSIS — F410 Panic disorder [episodic paroxysmal anxiety] without agoraphobia: Secondary | ICD-10-CM

## 2024-06-06 NOTE — Telephone Encounter (Signed)
 Patient stopped by office, notified patient of medications sent to pharmacy and notified patient she is to take only 1 tab 3 times daily instead of 2.   Scheduled patient for a bp check on 07/11/24.  Patient agrees and has no other questions.

## 2024-06-06 NOTE — Telephone Encounter (Signed)
 Left voicemail with information and also with appointment date and time.  Left voicemail asking to return call if has questions.

## 2024-06-10 ENCOUNTER — Other Ambulatory Visit: Payer: Self-pay | Admitting: Internal Medicine

## 2024-06-10 NOTE — Progress Notes (Signed)
 DAP Note   Provider/Clinician's Name: Camie Norris Higinio Chang, [place]   Client Name: Nakyiah Kuck   Date of Service: 04/04/2024   Duration: 60 mins.         Data: Client reported that she is doing okay. She reports that she is missing her mom so much. She states that she is going to do something fun for her birthday so that she does not stay at home and it just be another year that comes and go. She reports that she does not know what she is doing yet but she has been thinking about it. Client reports her anxiety level is at a 6 this week and would like to continue working to get that down to a 2 with the use of interventions.   Assessment: Client presented as a calm presence. As her birthday is approaching she is missing her mom a lot.  Thinking about another birthday where she will be without parents is a difficult place. It was positive that she was thinking of doing something different so that she can try to find her new normal through this grief.   Plan: Attend weekly sessions with a goal of consistently 4 times per month. Client will implement a moment of mindfulness at the start and end of each day to clear mind. Client will practice box breathing when anxiety is running high. Client will start writing down positive affirmations in a book to help boost self confidence. Client will continue to work through grief by finding acceptance and also her path forward.

## 2024-06-10 NOTE — Progress Notes (Signed)
 DAP Note   Provider/Clinician's Name: Camie Norris Higinio Chang, [place]   Client Name: Tonya Wilson   Date of Service: 04/11/2024   Duration: 60 mins.         Data: Client reported that she is doing pretty good this week. She reports that the family is getting together to mark the one year passing of her mother. She states that the shirts that they had made were perfect. She reports that they are making food and listening to her favorite music. Client reports her anxiety level is at a 6 this week and would like to continue working to get that down to a 2 with the use of interventions.   Assessment: Client presented as a calm presence. She was able to discuss all of this without tears. She was at peace with celebrating the life of her mother. She was able to engage and discuss good memories surrounding her death. This was the first time she was able to do this within this year.   Plan: Attend weekly sessions with a goal of consistently 4 times per month. Client will implement a moment of mindfulness at the start and end of each day to clear mind. Client will practice box breathing when anxiety is running high. Client will start writing down positive affirmations in a book to help boost self confidence. Client will continue to work through grief by finding acceptance and also her path forward.

## 2024-06-10 NOTE — Progress Notes (Signed)
 Called her today , but she did not answer. I left her a call back voice mail.

## 2024-06-13 ENCOUNTER — Ambulatory Visit: Admitting: Psychology

## 2024-06-13 DIAGNOSIS — F341 Dysthymic disorder: Secondary | ICD-10-CM

## 2024-06-13 DIAGNOSIS — F410 Panic disorder [episodic paroxysmal anxiety] without agoraphobia: Secondary | ICD-10-CM | POA: Diagnosis not present

## 2024-06-13 DIAGNOSIS — F411 Generalized anxiety disorder: Secondary | ICD-10-CM | POA: Diagnosis not present

## 2024-06-13 DIAGNOSIS — F4381 Prolonged grief disorder: Secondary | ICD-10-CM | POA: Diagnosis not present

## 2024-06-14 ENCOUNTER — Other Ambulatory Visit: Payer: Self-pay | Admitting: Internal Medicine

## 2024-06-18 ENCOUNTER — Telehealth: Payer: Self-pay | Admitting: Internal Medicine

## 2024-06-18 NOTE — Telephone Encounter (Signed)
 Patient called and states with new medication Clonidine  , patient's blood pressure has been elevated.    Patient also states she called [facility] pharmacy to obtain losartan  but was told they did not received prescription.   Per CMA, he will call pharmacy to see why they are not giving medication to patient as medication refill was sent on 04/29/2024.  Per CMA,will call patient back once he gets information.

## 2024-06-19 ENCOUNTER — Telehealth: Payer: Self-pay

## 2024-06-19 ENCOUNTER — Other Ambulatory Visit: Payer: Self-pay

## 2024-06-19 MED ORDER — LOSARTAN POTASSIUM 25 MG PO TABS: 25.0000 mg | ORAL_TABLET | Freq: Every day | ORAL | 11 refills | Status: AC

## 2024-06-19 NOTE — Telephone Encounter (Signed)
 I call the pharmacy and checked on Losartan , The said they don't the a new prescription for losartan . They said the last prescription for losartan  the received was August 20 - 20204.  We need to resend the medication

## 2024-06-19 NOTE — Telephone Encounter (Signed)
 I confirmed with pharmacy .They received RX and the patient can pick it on Friday. I called to notify the patient she did not answer so I left call back massage

## 2024-06-19 NOTE — Telephone Encounter (Signed)
 Losartan  is resent to the pharmacy  I am gonna confirm that the pharmacy received it this afternoon and then I will let the patient know

## 2024-06-24 NOTE — Telephone Encounter (Signed)
 I called her today. She did not answer , so I left a call back voice massage

## 2024-06-28 ENCOUNTER — Ambulatory Visit (INDEPENDENT_AMBULATORY_CARE_PROVIDER_SITE_OTHER): Admitting: Psychology

## 2024-06-28 DIAGNOSIS — F411 Generalized anxiety disorder: Secondary | ICD-10-CM

## 2024-06-28 DIAGNOSIS — F410 Panic disorder [episodic paroxysmal anxiety] without agoraphobia: Secondary | ICD-10-CM

## 2024-06-28 DIAGNOSIS — F341 Dysthymic disorder: Secondary | ICD-10-CM | POA: Diagnosis not present

## 2024-06-28 DIAGNOSIS — F4381 Prolonged grief disorder: Secondary | ICD-10-CM | POA: Diagnosis not present

## 2024-07-03 ENCOUNTER — Other Ambulatory Visit: Admitting: Psychology

## 2024-07-03 ENCOUNTER — Ambulatory Visit: Admitting: Psychology

## 2024-07-03 DIAGNOSIS — F341 Dysthymic disorder: Secondary | ICD-10-CM

## 2024-07-03 DIAGNOSIS — F411 Generalized anxiety disorder: Secondary | ICD-10-CM

## 2024-07-03 DIAGNOSIS — F410 Panic disorder [episodic paroxysmal anxiety] without agoraphobia: Secondary | ICD-10-CM

## 2024-07-03 DIAGNOSIS — F4381 Prolonged grief disorder: Secondary | ICD-10-CM

## 2024-07-05 NOTE — Telephone Encounter (Signed)
 ERROR

## 2024-07-10 ENCOUNTER — Other Ambulatory Visit: Admitting: Psychology

## 2024-07-11 ENCOUNTER — Other Ambulatory Visit

## 2024-07-22 NOTE — Progress Notes (Signed)
 DAP Note   Provider/Clinicians Name: Camie Norris Higinio Chang, [place]   Client Name: Tonya Wilson   Date of Service: 05/01/2024   Duration: 60 mins.         Data: Client reported that she is doing okay. Client is tearful. She states that she has been thinking a lot and she has come to the conclusion that she is not as far as she thinks that she should be in regards to the death of her mother. She states that she knows that she is really trying but that everything is a constant struggle and she has to force herself to do most everything. She also reports that she is tearful a lot and generally in a sad mood. Client reports her anxiety level is at a 8 this week and would like to continue working to get that down to a 2 with the use of interventions.   Assessment: Client presented calm but with a heavy sadness. We spent time discussing all the things that she has done. In a moment of defeat, it is important that the smallest of things are counted to show progression. 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 We discussed the importance of adding this so that we can adjust her treatment plan to help her continue to mentally heal and find that path forward. The trauma surrounding the death still plays a heavy role and is bing considered as a separate diagnosis.     Plan: Attend weekly sessions with a goal of consistently 4 times per month. Client will implement a moment of mindfulness at the start and end of each day to clear mind. Client will practice box breathing when anxiety is running high. Client will start writing down positive affirmations in a book to help boost self confidence. Client will continue to work through grief by finding acceptance and also her path forward.

## 2024-07-23 ENCOUNTER — Other Ambulatory Visit: Payer: Self-pay | Admitting: Internal Medicine

## 2024-07-23 ENCOUNTER — Other Ambulatory Visit: Admitting: Psychology

## 2024-07-24 ENCOUNTER — Other Ambulatory Visit: Admitting: Psychology

## 2024-07-29 NOTE — Progress Notes (Signed)
 DAP Note   Provider/Clinicians Name: Camie Norris Higinio Chang, [place]   Client Name: Tonya Wilson   Date of Service: 05/14/2024   Duration: 60 mins.         Data: Client reported that she is doing better this week. She states that she has just been trying to push through and not miss to much work. She reports that she has broken down a few times when it comes to thinking of mom and that she is sure this was the right call when it comes to the new diagnosis. Client reports her anxiety level is at a 7 this week and would like to continue working to get that down to a 2 with the use of interventions.   Assessment: Client presented calm and a little more upbeat compared to last week. She was alert and oriented. She had spent a considerable amount of time processing the new diagnosis and it seemed to give her a little peace and understanding when it comes to what she has been processing the last year.    Plan: Attend weekly sessions with a goal of consistently 4 times per month. Client will implement a moment of mindfulness at the start and end of each day to clear mind. Client will practice box breathing when anxiety is running high. Client will start writing down positive affirmations in a book to help boost self confidence. Client will continue to work through grief by finding acceptance and also her path forward.

## 2024-07-31 NOTE — Progress Notes (Signed)
 DAP Note   Provider/Clinicians Name: Camie Norris Higinio Chang, [place]   Client Name: Tonya Wilson   Date of Service: 05/22/2024   Duration: 60 mins.         Data: Client reported that she is hanging in there. She states that it has been tough at times this week. She states that she is excited about something because she plans to go see the kids this weekend for Halloween. She reports that this is bringing her joy and no matter what she is going to make herself go, even if she feels she does not want to go in the moment. Client reports her anxiety level is at a 7 this week and would like to continue working to get that down to a 2 with the use of interventions.   Assessment: Client presented as a calm presence. She was alert and oriented. Going to see the kids is giving her something to look forward to doing. It is important for her mental health that she has a nice balance of taking the time to grieve, while also not getting stuck there. She seems excited about going to see the kids and so hopefull that will also help get her there.    Plan: Attend weekly sessions with a goal of consistently 4 times per month. Client will implement a moment of mindfulness at the start and end of each day to clear mind. Client will practice box breathing when anxiety is running high. Client will start writing down positive affirmations in a book to help boost self confidence. Client will continue to work through grief by finding acceptance and also her path forward.

## 2024-08-02 ENCOUNTER — Other Ambulatory Visit: Admitting: Psychology

## 2024-08-02 NOTE — Progress Notes (Signed)
 DAP Note   Provider/Clinicians Name: Camie Norris Higinio Chang, [place]   Client Name: Solenne Manwarren   Date of Service: 05/28/2024   Duration: 60 mins.         Data: Client reported that she had an okay week. She states that she is continuing to push through. She reports that she did work all days last week. She states that she has been spending time with the kids and that still feels good. She reports that she is taking one day at a time. Client reports her anxiety level is at a 6 this week and would like to continue working to get that down to a 2 with the use of interventions.   Assessment: Client presented as a calm presence. She was alert and oriented. Mentally the client is in calm and protected place. She is doing her best to push through her responsibilities in life while trying to find the things that bring her joy. She has worked hard to get to this place. She still has hard days but it does not seem to have the same impact as it once did before. She is able to recover from them a little bit quicker.    Plan: Attend weekly sessions with a goal of consistently 4 times per month. Client will implement a moment of mindfulness at the start and end of each day to clear mind. Client will practice box breathing when anxiety is running high. Client will start writing down positive affirmations in a book to help boost self confidence. Client will continue to work through grief by finding acceptance and also her path forward.

## 2024-08-05 ENCOUNTER — Other Ambulatory Visit (INDEPENDENT_AMBULATORY_CARE_PROVIDER_SITE_OTHER)

## 2024-08-05 DIAGNOSIS — D259 Leiomyoma of uterus, unspecified: Secondary | ICD-10-CM | POA: Diagnosis not present

## 2024-08-05 DIAGNOSIS — Z79899 Other long term (current) drug therapy: Secondary | ICD-10-CM | POA: Diagnosis not present

## 2024-08-05 DIAGNOSIS — I151 Hypertension secondary to other renal disorders: Secondary | ICD-10-CM | POA: Diagnosis not present

## 2024-08-05 DIAGNOSIS — D352 Benign neoplasm of pituitary gland: Secondary | ICD-10-CM

## 2024-08-05 MED ORDER — DAPAGLIFLOZIN PROPANEDIOL 5 MG PO TABS: 5.0000 mg | ORAL_TABLET | Freq: Every day | ORAL | 11 refills | Status: AC

## 2024-08-05 NOTE — Progress Notes (Signed)
 The patient was dehydrated so were unable to draw blood RX for farxigo was sent to the pharmacy on [address]

## 2024-08-06 NOTE — Progress Notes (Signed)
 DAP Note   Provider/Clinicians Name: Camie Norris Higinio Chang, [place]   Client Name: Tonya Wilson   Date of Service: 06/06/2024   Duration: 60 mins.         Data: Client reported that she had an okay week. She states that she is attempting to push through hard things. She reports that she is trying to put herself out there more. She states that she continues to spend time with her close circle and even though she has to make herself she continues to participate in activities with them.  Client reports her anxiety level is at a 6 this week and would like to continue working to get that down to a 2 with the use of interventions.   Assessment: Client presented as a calm presence. She was alert and oriented. It is evident that she is trying to push herself over this grief hump and put herself out there more. While she does have to put the effort in to get there she does appear to be enjoying herself once she is there. It is important that she is surrounding herself with people who support her and that are willing to be patient as she continues to work through the grief.    Plan: Attend weekly sessions with a goal of consistently 4 times per month. Client will implement a moment of mindfulness at the start and end of each day to clear mind. Client will practice box breathing when anxiety is running high. Client will start writing down positive affirmations in a book to help boost self confidence. Client will continue to work through grief by finding acceptance and also her path forward.

## 2024-08-07 ENCOUNTER — Telehealth: Admitting: Psychology

## 2024-08-07 NOTE — Progress Notes (Signed)
 DAP Note   Provider/Clinicians Name: Camie Norris Higinio Chang, [place]   Client Name: Tonya Wilson   Date of Service: 06/13/2024   Duration: 60 mins.         Data: Client reported that she was doing okay. She states that she had a long talk with he sister and states that she has been distant because she is also grieving the loss of their mother at an inconceivable level. She reports that it was hard to hear but at least she understands. She also stated that she hates that she feels that way but it does help her to not feel so alone. Client reports her anxiety level is at a 6 this week and would like to continue working to get that down to a 2 with the use of interventions.   Assessment: Client presented as a calm presence. She was alert and oriented. It was very important that she have this talk with her sister. They have been so close in the past and while she was pretty sure that this was not personal she needed to here this.  At times she has felt very alone so it is also important for her to know that she is not alone. They can be very beneficial to each other when it comes to their healing.     Plan: Attend weekly sessions with a goal of consistently 4 times per month. Client will implement a moment of mindfulness at the start and end of each day to clear mind. Client will practice box breathing when anxiety is running high. Client will start writing down positive affirmations in a book to help boost self confidence. Client will continue to work through grief by finding acceptance and also her path forward.

## 2024-08-08 ENCOUNTER — Encounter: Admitting: Internal Medicine

## 2024-08-09 LAB — SPECIMEN STATUS REPORT

## 2024-08-09 LAB — COMPREHENSIVE METABOLIC PANEL WITH GFR

## 2024-08-09 LAB — LIPID PANEL W/O CHOL/HDL RATIO

## 2024-08-10 LAB — COMPREHENSIVE METABOLIC PANEL WITH GFR

## 2024-08-10 LAB — LIPID PANEL W/O CHOL/HDL RATIO

## 2024-08-10 LAB — CBC WITH DIFFERENTIAL/PLATELET

## 2024-08-10 LAB — MICROALBUMIN / CREATININE URINE RATIO
Creatinine, Urine: 51.6 mg/dL
Microalb/Creat Ratio: 219 mg/g{creat} — ABNORMAL HIGH (ref 0–29)
Microalbumin, Urine: 112.8 ug/mL

## 2024-08-13 ENCOUNTER — Other Ambulatory Visit: Admitting: Psychology

## 2024-08-13 ENCOUNTER — Encounter: Payer: Self-pay | Admitting: Internal Medicine

## 2024-08-13 ENCOUNTER — Other Ambulatory Visit: Payer: Self-pay | Admitting: Internal Medicine

## 2024-08-13 ENCOUNTER — Ambulatory Visit: Admitting: Internal Medicine

## 2024-08-13 VITALS — BP 120/86 | HR 60 | Resp 12 | Ht 62.0 in | Wt 163.0 lb

## 2024-08-13 DIAGNOSIS — I151 Hypertension secondary to other renal disorders: Secondary | ICD-10-CM

## 2024-08-13 DIAGNOSIS — Z23 Encounter for immunization: Secondary | ICD-10-CM

## 2024-08-13 DIAGNOSIS — N183 Chronic kidney disease, stage 3 unspecified: Secondary | ICD-10-CM

## 2024-08-13 DIAGNOSIS — D352 Benign neoplasm of pituitary gland: Secondary | ICD-10-CM

## 2024-08-13 DIAGNOSIS — E7800 Pure hypercholesterolemia, unspecified: Secondary | ICD-10-CM | POA: Insufficient documentation

## 2024-08-13 DIAGNOSIS — R809 Proteinuria, unspecified: Secondary | ICD-10-CM

## 2024-08-13 DIAGNOSIS — Z124 Encounter for screening for malignant neoplasm of cervix: Secondary | ICD-10-CM

## 2024-08-13 DIAGNOSIS — Z1231 Encounter for screening mammogram for malignant neoplasm of breast: Secondary | ICD-10-CM

## 2024-08-13 DIAGNOSIS — Z0000 Encounter for general adult medical examination without abnormal findings: Secondary | ICD-10-CM

## 2024-08-13 MED ORDER — FLUTICASONE PROPIONATE 50 MCG/ACT NA SUSP: 2.0000 | Freq: Every day | NASAL | 11 refills | Status: AC

## 2024-08-13 MED ORDER — CLOBETASOL PROPIONATE 0.05 % EX CREA: 1.0000 | TOPICAL_CREAM | Freq: Two times a day (BID) | CUTANEOUS | 2 refills | Status: AC

## 2024-08-13 MED ORDER — AMLODIPINE BESYLATE 5 MG PO TABS: 5.0000 mg | ORAL_TABLET | Freq: Every day | ORAL | 3 refills | Status: AC

## 2024-08-13 NOTE — Progress Notes (Unsigned)
 "   Subjective:    Patient ID: Tonya Wilson, female   DOB: [birth year], 43 y.o.   MRN: [id number]   HPI  CPE with pap  1.  Pap: last in 2019 or 2020 and was normal.  She thinks her pap may have been abnormal once in distant past, but on recheck was fine.  No treatment necessary.    2.  Mammogram: Never.  No family history of breast cancer.    3.  Osteoprevention:  Drinks almond milk--only 3 times weekly.  Vitamin D  level generally low, despite taking Vitamin D3 1000 units daily.  Does get outside regularly with walking, but not recently with cold weather.  4.  Guaiac Cards/FIT:  Never.   5.  Colonoscopy:  2022 with Dr. Teressa with hyperplastic polyp only.  Due to maternal grandfather diagnosed  with colon cancer around age 61, recommended repeat in 5 years and due in 2027.  6.  Immunizations:  Felt she got really ill with influenza when a child, so stopped getting them.  Does not want COVID as a previous provider told her not to get one if she has such a reaction to influenza.  Needs Td today.   Immunization History  Administered Date(s) Administered   MMR 11/27/2013, 03/14/2014   Td (Adult),5 Lf Tetanus Toxid, Preservative Free 03/14/2014   Tdap 11/27/2013     7.  Glucose/Cholesterol:  Sugars have been fine.  Cholesterol mildly high in past with excellent HDL    Lipid Panel     Component Value Date/Time   CHOL CANCELED 08/05/2024 0943   TRIG CANCELED 08/05/2024 0943   HDL CANCELED 08/05/2024 0943   LDLCALC 114 (H) 11/15/2022 1203   LABVLDL CANCELED 08/05/2024 0943    .  Current Meds  Medication Sig   acetaminophen  (TYLENOL ) 500 MG tablet Take 1,000 mg by mouth every 6 (six) hours as needed. For pain   albuterol  (VENTOLIN  HFA) 108 (90 Base) MCG/ACT inhaler INHALE 1 TO 2 PUFFS INTO THE LUNGS EVERY 6 HOURS AS NEEDED FOR WHEEZING OR SHORTNESS OF BREATH   cabergoline  (DOSTINEX ) 0.5 MG tablet Take 1 tablet (0.5 mg total) by mouth 2 (two) times a week.    Cholecalciferol (VITAMIN D3) 25 MCG (1000 UT) CAPS Take 1 capsule (1,000 Units total) by mouth daily.   cloNIDine  (CATAPRES ) 0.1 MG tablet Take 1 tablet (0.1 mg total) by mouth 3 (three) times daily.   dapagliflozin  propanediol (FARXIGA ) 5 MG TABS tablet Take 1 tablet (5 mg total) by mouth daily.   fluticasone -salmeterol (WIXELA INHUB) 100-50 MCG/ACT AEPB Inhale 1 puff into the lungs 2 (two) times daily.   levocetirizine (XYZAL ) 5 MG tablet Take 1 tablet (5 mg total) by mouth every evening.   losartan  (COZAAR ) 25 MG tablet Take 1 tablet (25 mg total) by mouth daily.   sodium bicarbonate  650 MG tablet Take 1 tablet (650 mg total) by mouth 2 (two) times daily.   traZODone  (DESYREL ) 50 MG tablet 1/2 to 1 tab BY MOUTH AT BEDTIME   [DISCONTINUED] amLODipine  (NORVASC ) 5 MG tablet Take 1 tablet (5 mg total) by mouth daily. TAKE 1 TABLET(5 MG) BY MOUTH DAILY   [DISCONTINUED] fluticasone  (FLONASE ) 50 MCG/ACT nasal spray Place 2 sprays into both nostrils daily.   Allergies  Allergen Reactions   Other Other (See Comments)    NO NSAIDS 2/2 CKD PER PT   Peanut-Containing Drug Products Swelling    States that lips swelled after eating   Flagyl  [Metronidazole ] Rash  Review of Systems  HENT:  Negative for dental problem.   Eyes:  Negative for visual disturbance (Goes to [facility]  Respiratory:  Negative for shortness of breath.   Cardiovascular:  Positive for palpitations (with anxiety). Negative for chest pain and leg swelling.  Gastrointestinal:  Negative for abdominal pain and blood in stool (No melena).  Genitourinary:  Negative for menstrual problem (Regular now back on dostinex ).  Allergic/Immunologic:       Allergies fairly well controlled        Objective:   BP 120/86 (BP Location: Right Arm, Patient Position: Sitting, Cuff Size: Normal)   Pulse 60   Resp 12   Ht 5' 2 (1.575 m)   Wt 163 lb (73.9 kg)   LMP 08/09/2024 (Exact Date)   BMI 29.81 kg/m    Physical Exam   Assessment & Plan  "

## 2024-08-15 LAB — CYTOLOGY - PAP

## 2024-08-16 ENCOUNTER — Other Ambulatory Visit (INDEPENDENT_AMBULATORY_CARE_PROVIDER_SITE_OTHER)

## 2024-08-16 DIAGNOSIS — Z79899 Other long term (current) drug therapy: Secondary | ICD-10-CM

## 2024-08-16 DIAGNOSIS — D352 Benign neoplasm of pituitary gland: Secondary | ICD-10-CM | POA: Diagnosis not present

## 2024-08-16 DIAGNOSIS — Z23 Encounter for immunization: Secondary | ICD-10-CM | POA: Diagnosis not present

## 2024-08-17 LAB — COMPREHENSIVE METABOLIC PANEL WITH GFR
ALT: 6 [IU]/L (ref 0–32)
AST: 11 [IU]/L (ref 0–40)
Albumin: 4.3 g/dL (ref 3.9–4.9)
Alkaline Phosphatase: 65 [IU]/L (ref 41–116)
BUN/Creatinine Ratio: 9 (ref 9–23)
BUN: 15 mg/dL (ref 6–24)
Bilirubin Total: 0.3 mg/dL (ref 0.0–1.2)
CO2: 18 mmol/L — ABNORMAL LOW (ref 20–29)
Calcium: 8.8 mg/dL (ref 8.7–10.2)
Chloride: 108 mmol/L — ABNORMAL HIGH (ref 96–106)
Creatinine, Ser: 1.67 mg/dL — ABNORMAL HIGH (ref 0.57–1.00)
Globulin, Total: 3.2 g/dL (ref 1.5–4.5)
Glucose: 87 mg/dL (ref 70–99)
Potassium: 4.6 mmol/L (ref 3.5–5.2)
Sodium: 140 mmol/L (ref 134–144)
Total Protein: 7.5 g/dL (ref 6.0–8.5)
eGFR: 39 mL/min/{1.73_m2} — ABNORMAL LOW

## 2024-08-17 LAB — LIPID PANEL W/O CHOL/HDL RATIO
Cholesterol, Total: 241 mg/dL — ABNORMAL HIGH (ref 100–199)
HDL: 70 mg/dL
LDL Chol Calc (NIH): 158 mg/dL — ABNORMAL HIGH (ref 0–99)
Triglycerides: 78 mg/dL (ref 0–149)
VLDL Cholesterol Cal: 13 mg/dL (ref 5–40)

## 2024-08-17 LAB — PROLACTIN: Prolactin: 4 ng/mL — ABNORMAL LOW (ref 4.8–33.4)

## 2024-08-19 ENCOUNTER — Other Ambulatory Visit: Admitting: Psychology

## 2024-08-19 ENCOUNTER — Ambulatory Visit: Payer: Self-pay | Admitting: Internal Medicine

## 2024-08-20 ENCOUNTER — Ambulatory Visit

## 2024-08-21 ENCOUNTER — Telehealth: Payer: Self-pay

## 2024-08-21 NOTE — Progress Notes (Signed)
 DAP Note   Provider/Clinicians Name: Camie Norris Higinio Chang, [place]   Client Name: Tonya Wilson   Date of Service: 06/28/2024   Duration: 60 mins.         Data: Client reported that she was doing okay. She states that she made it through the entire week of work. She reports that the holiday was good. She states that she spent time with family and it was good for the most part. She states that when she was done she was done. She states that when she got home she crashed. She reports that she is happy that she did it. Client reports her anxiety level is at a 6 this week and would like to continue working to get that down to a 2 with the use of interventions.   Assessment: Client presented as a calm presence. She was alert and oriented. It continues to be hard for her to see the progress that she has made because she focuses on the fact that she is still having such a hard time. With that said she is making progress. Making it through work and pushing herself to spend time outside of the home with people that she loves is progression. Not even 6 months ago this was complete opposite, where she was having a hard time getting through most things. She still has hard days but everyday she is making more and more progress and it is important she knows that.   Plan: Attend weekly sessions with a goal of consistently 4 times per month. Client will implement a moment of mindfulness at the start and end of each day to clear mind. Client will practice box breathing when anxiety is running high. Client will start writing down positive affirmations in a book to help boost self confidence. Client will continue to work through grief by finding acceptance and also her path forward.

## 2024-08-21 NOTE — Telephone Encounter (Signed)
 The patient called today stating that she saw Dr. Felix Massage and that she wants her referral to be sent to [facility] Address [address] [place] [address]  Phone#  [phone]

## 2024-08-22 NOTE — Progress Notes (Signed)
 DAP Note   Provider/Clinicians Name: Camie Norris Higinio Chang, [place]   Client Name: Tonya Wilson   Date of Service: 07/03/2024   Duration: 60 mins.         Data: Client reported that she is doing pretty good. She was alert and oriented. She states she has been pushing through. She states that even though she has to force herself at times she has been keeping herself busy. She states that she is just trying to feel some normalcy again. Client reports her anxiety level is at a 6 this week and would like to continue working to get that down to a 2 with the use of interventions.   Assessment: Client presented as a calm presence. She continues to try and push herself through the hurt and resume some sort of normalcy with her life. She knows how important it is that she continues to embrace the grief so that she can continue to heal.    Plan: Attend weekly sessions with a goal of consistently 4 times per month. Client will implement a moment of mindfulness at the start and end of each day to clear mind. Client will practice box breathing when anxiety is running high. Client will start writing down positive affirmations in a book to help boost self confidence. Client will continue to work through grief by finding acceptance and also her path forward.

## 2024-08-27 ENCOUNTER — Other Ambulatory Visit: Admitting: Psychology

## 2024-09-04 ENCOUNTER — Other Ambulatory Visit: Admitting: Psychology

## 2024-11-21 ENCOUNTER — Other Ambulatory Visit

## 2025-02-12 ENCOUNTER — Ambulatory Visit: Admitting: Internal Medicine

## 2025-08-15 ENCOUNTER — Other Ambulatory Visit: Admitting: Internal Medicine

## 2025-08-19 ENCOUNTER — Encounter: Admitting: Internal Medicine
